# Patient Record
Sex: Female | Born: 1985 | Race: White | Hispanic: No | Marital: Single | Smoking: Former smoker
Health system: Southern US, Community
[De-identification: ages and names within clinical notes are randomized; demographics above are authoritative.]

## PROBLEM LIST (undated history)

## (undated) DIAGNOSIS — R102 Pelvic and perineal pain: Secondary | ICD-10-CM

## (undated) DIAGNOSIS — N926 Irregular menstruation, unspecified: Secondary | ICD-10-CM

## (undated) DIAGNOSIS — E079 Disorder of thyroid, unspecified: Secondary | ICD-10-CM

## (undated) DIAGNOSIS — C73 Malignant neoplasm of thyroid gland: Secondary | ICD-10-CM

## (undated) DIAGNOSIS — N83201 Unspecified ovarian cyst, right side: Secondary | ICD-10-CM

## (undated) HISTORY — PX: THYROID SURGERY: SHX805

## (undated) HISTORY — PX: TONSILLECTOMY: SUR1361

## (undated) HISTORY — DX: Unspecified ovarian cyst, right side: N83.201

## (undated) HISTORY — PX: OTHER SURGICAL HISTORY: SHX169

## (undated) HISTORY — DX: Irregular menstruation, unspecified: N92.6

## (undated) HISTORY — DX: Pelvic and perineal pain: R10.2

---

## 2014-10-06 ENCOUNTER — Emergency Department (HOSPITAL_COMMUNITY)
Admission: EM | Admit: 2014-10-06 | Discharge: 2014-10-06 | Disposition: A | Discharge status: Home / Self Care | Payer: Self-pay | Attending: Emergency Medicine | Admitting: Emergency Medicine

## 2014-10-06 ENCOUNTER — Encounter (HOSPITAL_COMMUNITY): Payer: Self-pay | Admitting: *Deleted

## 2014-10-06 DIAGNOSIS — Z8639 Personal history of other endocrine, nutritional and metabolic disease: Secondary | ICD-10-CM | POA: Insufficient documentation

## 2014-10-06 DIAGNOSIS — R590 Localized enlarged lymph nodes: Secondary | ICD-10-CM | POA: Insufficient documentation

## 2014-10-06 DIAGNOSIS — K029 Dental caries, unspecified: Secondary | ICD-10-CM | POA: Insufficient documentation

## 2014-10-06 DIAGNOSIS — K047 Periapical abscess without sinus: Secondary | ICD-10-CM | POA: Insufficient documentation

## 2014-10-06 DIAGNOSIS — K002 Abnormalities of size and form of teeth: Secondary | ICD-10-CM | POA: Insufficient documentation

## 2014-10-06 HISTORY — DX: Disorder of thyroid, unspecified: E07.9

## 2014-10-06 MED ORDER — AMOXICILLIN 500 MG PO CAPS: 500.0000 mg | ORAL_CAPSULE | Freq: Three times a day (TID) | ORAL | Status: AC

## 2014-10-06 MED ORDER — TRAMADOL HCL 50 MG PO TABS: 50.0000 mg | ORAL_TABLET | Freq: Four times a day (QID) | ORAL | Status: DC | PRN

## 2014-10-06 NOTE — ED Notes (Signed)
Pt says rt side of face hurts, thinks it is her teeth or rt ear.

## 2014-10-06 NOTE — ED Provider Notes (Signed)
CSN: [id number]     Arrival date & time 10/06/14  1517 History   First MD Initiated Contact with Patient 10/06/14 1606     Chief Complaint  Patient presents with  . Facial Pain     (Consider location/radiation/quality/duration/timing/severity/associated sxs/prior Treatment) The history is provided by the patient.   Anne Hamilton is a 29 y.o. female presenting with a several day history of dental pain and gingival swelling.   The patient has a history of injury and/or decay in the teeth involved which has recently started to cause increased  pain. She had dental extractions by her dentist in [facility] one month ago on the left side of her mouth, has just moved to this area and now has new pain in old decayed teeth on the left. There has been no fevers, chills, nausea or vomiting, also no complaint of difficulty swallowing, although chewing makes pain worse.  The patient has tried tylenol, motrin and peroxide swish and spit without relief of symptoms.      Past Medical History  Diagnosis Date  . Thyroid disease    Past Surgical History  Procedure Laterality Date  . Tonsillectomy    . Tubes in ears.    . Thyroid surgery     History reviewed. No pertinent family history. History  Substance Use Topics  . Smoking status: Never Smoker   . Smokeless tobacco: Not on file  . Alcohol Use: No   OB History    No data available     Review of Systems  Constitutional: Negative for fever.  HENT: Positive for dental problem. Negative for facial swelling and sore throat.   Respiratory: Negative for shortness of breath.   Musculoskeletal: Negative for neck pain and neck stiffness.      Allergies  Diclofenac  Home Medications   Prior to Admission medications   Medication Sig Start Date End Date Taking? Authorizing Provider  amoxicillin (AMOXIL) 500 MG capsule Take 1 capsule (500 mg total) by mouth 3 (three) times daily. 10/06/14 10/16/14  Evalee Jefferson, PA-C  traMADol (ULTRAM) 50 MG tablet Take 1  tablet (50 mg total) by mouth every 6 (six) hours as needed. 10/06/14   Evalee Jefferson, PA-C   BP 118/88 mmHg  Pulse 76  Temp(Src) 99 F (37.2 C) (Oral)  Resp 18  Ht 5\' 6"  (1.676 m)  Wt 134 lb (60.782 kg)  BMI 21.64 kg/m2  SpO2 100%  LMP 09/05/2014 (Approximate) Physical Exam  Constitutional: She is oriented to person, place, and time. She appears well-developed and well-nourished. No distress.  HENT:  Head: Normocephalic and atraumatic.  Right Ear: Tympanic membrane and external ear normal.  Left Ear: Tympanic membrane and external ear normal.  Mouth/Throat: Oropharynx is clear and moist and mucous membranes are normal. No oral lesions. No trismus in the jaw. Abnormal dentition. Dental caries present. No dental abscesses.  Deep decay of right upper premolars and molars along the gingival line, gingival edema and erythema without abscess.  No drainage. Deep decay of right lower 2nd molar as well.  Eyes: Conjunctivae are normal.  Neck: Normal range of motion. Neck supple.  Cardiovascular: Normal rate and normal heart sounds.   Pulmonary/Chest: Effort normal.  Abdominal: She exhibits no distension.  Musculoskeletal: Normal range of motion.  Lymphadenopathy:       Head (right side): Submandibular adenopathy present.    She has no cervical adenopathy.  Neurological: She is alert and oriented to person, place, and time.  Skin: Skin is warm and  dry. No erythema.  Psychiatric: She has a normal mood and affect.    ED Course  Procedures (including critical care time) Labs Review Labs Reviewed - No data to display  Imaging Review No results found.   EKG Interpretation None      MDM   Final diagnoses:  Dental infection    Dental referrals given.  Amoxil, tramadol.  Prn f/u.    Evalee Jefferson, PA-C 10/06/14 1628  Daleen Bo, MD 10/07/14 [phone]

## 2014-10-06 NOTE — Discharge Instructions (Signed)
Dental Abscess A dental abscess is a collection of infected fluid (pus) from a bacterial infection in the inner part of the tooth (pulp). It usually occurs at the end of the tooth's root.  CAUSES   Severe tooth decay.  Trauma to the tooth that allows bacteria to enter into the pulp, such as a broken or chipped tooth. SYMPTOMS   Severe pain in and around the infected tooth.  Swelling and redness around the abscessed tooth or in the mouth or face.  Tenderness.  Pus drainage.  Bad breath.  Bitter taste in the mouth.  Difficulty swallowing.  Difficulty opening the mouth.  Nausea.  Vomiting.  Chills.  Swollen neck glands. DIAGNOSIS   A medical and dental history will be taken.  An examination will be performed by tapping on the abscessed tooth.  X-rays may be taken of the tooth to identify the abscess. TREATMENT The goal of treatment is to eliminate the infection. You may be prescribed antibiotic medicine to stop the infection from spreading. A root canal may be performed to save the tooth. If the tooth cannot be saved, it may be pulled (extracted) and the abscess may be drained.  HOME CARE INSTRUCTIONS  Only take over-the-counter or prescription medicines for pain, fever, or discomfort as directed by your caregiver.  Rinse your mouth (gargle) often with salt water ( tsp salt in 8 oz [250 ml] of warm water) to relieve pain or swelling.  Do not drive after taking pain medicine (narcotics).  Do not apply heat to the outside of your face.  Return to your dentist for further treatment as directed. SEEK MEDICAL CARE IF:  Your pain is not helped by medicine.  Your pain is getting worse instead of better. SEEK IMMEDIATE MEDICAL CARE IF:  You have a fever or persistent symptoms for more than 2-3 days.  You have a fever and your symptoms suddenly get worse.  You have chills or a very bad headache.  You have problems breathing or swallowing.  You have trouble  opening your mouth.  You have swelling in the neck or around the eye. Document Released: 06/11/2005 Document Revised: 03/05/2012 Document Reviewed: 09/19/2010 [facility] Patient Information 2015 [place], [place]. This information is not intended to replace advice given to you by your health care provider. Make sure you discuss any questions you have with your health care provider.   Complete your entire course of antibiotics as prescribed.  You  may use the tramadol for pain relief but do not drive within 4 hours of taking as this will make you drowsy.  Avoid applying heat or ice to this abscess area which can worsen your symptoms.  You may use warm salt water swish and spit treatment  to keep this area cleaner as discussed.  Call the dentist listed above for further management of your symptoms.

## 2014-11-01 ENCOUNTER — Emergency Department (HOSPITAL_COMMUNITY)
Admission: EM | Admit: 2014-11-01 | Discharge: 2014-11-02 | Disposition: A | Discharge status: Home / Self Care | Payer: Self-pay | Attending: Emergency Medicine | Admitting: Emergency Medicine

## 2014-11-01 ENCOUNTER — Encounter (HOSPITAL_COMMUNITY): Payer: Self-pay | Admitting: Cardiology

## 2014-11-01 DIAGNOSIS — N3000 Acute cystitis without hematuria: Secondary | ICD-10-CM

## 2014-11-01 DIAGNOSIS — N8320 Unspecified ovarian cysts: Secondary | ICD-10-CM | POA: Insufficient documentation

## 2014-11-01 DIAGNOSIS — N3090 Cystitis, unspecified without hematuria: Secondary | ICD-10-CM | POA: Insufficient documentation

## 2014-11-01 DIAGNOSIS — N83202 Unspecified ovarian cyst, left side: Secondary | ICD-10-CM

## 2014-11-01 DIAGNOSIS — Z3202 Encounter for pregnancy test, result negative: Secondary | ICD-10-CM | POA: Insufficient documentation

## 2014-11-01 DIAGNOSIS — Z8639 Personal history of other endocrine, nutritional and metabolic disease: Secondary | ICD-10-CM | POA: Insufficient documentation

## 2014-11-01 LAB — CBC WITH DIFFERENTIAL/PLATELET
BASOS ABS: 0.1 10*3/uL (ref 0.0–0.1)
BASOS PCT: 1 % (ref 0–1)
Eosinophils Absolute: 0.4 10*3/uL (ref 0.0–0.7)
Eosinophils Relative: 6 % — ABNORMAL HIGH (ref 0–5)
HEMATOCRIT: 36.4 % (ref 36.0–46.0)
Hemoglobin: 12.6 g/dL (ref 12.0–15.0)
LYMPHS PCT: 42 % (ref 12–46)
Lymphs Abs: 2.8 10*3/uL (ref 0.7–4.0)
MCH: 30.4 pg (ref 26.0–34.0)
MCHC: 34.6 g/dL (ref 30.0–36.0)
MCV: 87.9 fL (ref 78.0–100.0)
MONO ABS: 0.2 10*3/uL (ref 0.1–1.0)
Monocytes Relative: 4 % (ref 3–12)
NEUTROS ABS: 3.1 10*3/uL (ref 1.7–7.7)
NEUTROS PCT: 47 % (ref 43–77)
Platelets: 227 10*3/uL (ref 150–400)
RBC: 4.14 MIL/uL (ref 3.87–5.11)
RDW: 11.8 % (ref 11.5–15.5)
WBC: 6.6 10*3/uL (ref 4.0–10.5)

## 2014-11-01 LAB — BASIC METABOLIC PANEL
Anion gap: 7 (ref 5–15)
BUN: 14 mg/dL (ref 6–20)
CALCIUM: 9.2 mg/dL (ref 8.9–10.3)
CHLORIDE: 104 mmol/L (ref 101–111)
CO2: 26 mmol/L (ref 22–32)
Creatinine, Ser: 0.84 mg/dL (ref 0.44–1.00)
GFR calc Af Amer: >60 mL/min (ref >60)
GFR calc non Af Amer: >60 mL/min (ref >60)
GLUCOSE: 111 mg/dL — AB (ref 70–99)
Potassium: 4 mmol/L (ref 3.5–5.1)
Sodium: 137 mmol/L (ref 135–145)

## 2014-11-01 LAB — URINALYSIS, ROUTINE W REFLEX MICROSCOPIC
Bilirubin Urine: NEGATIVE
GLUCOSE, UA: NEGATIVE mg/dL
Ketones, ur: NEGATIVE mg/dL
Nitrite: NEGATIVE
PH: 5.5 (ref 5.0–8.0)
Protein, ur: NEGATIVE mg/dL
Specific Gravity, Urine: 1.025 (ref 1.005–1.030)
Urobilinogen, UA: 0.2 mg/dL (ref 0.0–1.0)

## 2014-11-01 LAB — WET PREP, GENITAL
CLUE CELLS WET PREP: NONE SEEN
Trich, Wet Prep: NONE SEEN
YEAST WET PREP: NONE SEEN

## 2014-11-01 LAB — URINE MICROSCOPIC-ADD ON

## 2014-11-01 LAB — PREGNANCY, URINE: Preg Test, Ur: NEGATIVE

## 2014-11-01 MED ORDER — OXYCODONE-ACETAMINOPHEN 5-325 MG PO TABS: 1.0000 | ORAL_TABLET | Freq: Four times a day (QID) | ORAL | Status: DC | PRN

## 2014-11-01 MED ORDER — SULFAMETHOXAZOLE-TRIMETHOPRIM 800-160 MG PO TABS: 1.0000 | ORAL_TABLET | Freq: Two times a day (BID) | ORAL | Status: AC

## 2014-11-01 MED ORDER — OXYCODONE-ACETAMINOPHEN 5-325 MG PO TABS
1.0000 | ORAL_TABLET | Freq: Once | ORAL | Status: AC
  Administered 2014-11-01: 1 via ORAL
  Filled 2014-11-01: qty 1

## 2014-11-01 NOTE — ED Notes (Signed)
LLQ abdominal pain since yesterday.

## 2014-11-01 NOTE — Discharge Instructions (Signed)
Ovarian Cyst An ovarian cyst is a fluid-filled sac that forms on an ovary. The ovaries are small organs that produce eggs in women. Various types of cysts can form on the ovaries. Most are not cancerous. Many do not cause problems, and they often go away on their own. Some may cause symptoms and require treatment. Common types of ovarian cysts include:  Functional cysts--These cysts may occur every month during the menstrual cycle. This is normal. The cysts usually go away with the next menstrual cycle if the woman does not get pregnant. Usually, there are no symptoms with a functional cyst.  Endometrioma cysts--These cysts form from the tissue that lines the uterus. They are also called "chocolate cysts" because they become filled with blood that turns brown. This type of cyst can cause pain in the lower abdomen during intercourse and with your menstrual period.  Cystadenoma cysts--This type develops from the cells on the outside of the ovary. These cysts can get very big and cause lower abdomen pain and pain with intercourse. This type of cyst can twist on itself, cut off its blood supply, and cause severe pain. It can also easily rupture and cause a lot of pain.  Dermoid cysts--This type of cyst is sometimes found in both ovaries. These cysts may contain different kinds of body tissue, such as skin, teeth, hair, or cartilage. They usually do not cause symptoms unless they get very big.  Theca lutein cysts--These cysts occur when too much of a certain hormone (human chorionic gonadotropin) is produced and overstimulates the ovaries to produce an egg. This is most common after procedures used to assist with the conception of a baby (in vitro fertilization). CAUSES   Fertility drugs can cause a condition in which multiple large cysts are formed on the ovaries. This is called ovarian hyperstimulation syndrome.  A condition called polycystic ovary syndrome can cause hormonal imbalances that can lead to  nonfunctional ovarian cysts. SIGNS AND SYMPTOMS  Many ovarian cysts do not cause symptoms. If symptoms are present, they may include:  Pelvic pain or pressure.  Pain in the lower abdomen.  Pain during sexual intercourse.  Increasing girth (swelling) of the abdomen.  Abnormal menstrual periods.  Increasing pain with menstrual periods.  Stopping having menstrual periods without being pregnant. DIAGNOSIS  These cysts are commonly found during a routine or annual pelvic exam. Tests may be ordered to find out more about the cyst. These tests may include:  Ultrasound.  X-ray of the pelvis.  CT scan.  MRI.  Blood tests. TREATMENT  Many ovarian cysts go away on their own without treatment. Your health care provider may want to check your cyst regularly for 2-3 months to see if it changes. For women in menopause, it is particularly important to monitor a cyst closely because of the higher rate of ovarian cancer in menopausal women. When treatment is needed, it may include any of the following:  A procedure to drain the cyst (aspiration). This may be done using a long needle and ultrasound. It can also be done through a laparoscopic procedure. This involves using a thin, lighted tube with a tiny camera on the end (laparoscope) inserted through a small incision.  Surgery to remove the whole cyst. This may be done using laparoscopic surgery or an open surgery involving a larger incision in the lower abdomen.  Hormone treatment or birth control pills. These methods are sometimes used to help dissolve a cyst. HOME CARE INSTRUCTIONS   Only take over-the-counter  or prescription medicines as directed by your health care provider.  Follow up with your health care provider as directed.  Get regular pelvic exams and Pap tests. SEEK MEDICAL CARE IF:   Your periods are late, irregular, or painful, or they stop.  Your pelvic pain or abdominal pain does not go away.  Your abdomen becomes  larger or swollen.  You have pressure on your bladder or trouble emptying your bladder completely.  You have pain during sexual intercourse.  You have feelings of fullness, pressure, or discomfort in your stomach.  You lose weight for no apparent reason.  You feel generally ill.  You become constipated.  You lose your appetite.  You develop acne.  You have an increase in body and facial hair.  You are gaining weight, without changing your exercise and eating habits.  You think you are pregnant. SEEK IMMEDIATE MEDICAL CARE IF:   You have increasing abdominal pain.  You feel sick to your stomach (nauseous), and you throw up (vomit).  You develop a fever that comes on suddenly.  You have abdominal pain during a bowel movement.  Your menstrual periods become heavier than usual. MAKE SURE YOU:  Understand these instructions.  Will watch your condition.  Will get help right away if you are not doing well or get worse. Document Released: 06/11/2005 Document Revised: 06/16/2013 Document Reviewed: 02/16/2013 [facility] Patient Information 2015 [place], [place]. This information is not intended to replace advice given to you by your health care provider. Make sure you discuss any questions you have with your health care provider.  Urinary Tract Infection Urinary tract infections (UTIs) can develop anywhere along your urinary tract. Your urinary tract is your body's drainage system for removing wastes and extra water. Your urinary tract includes two kidneys, two ureters, a bladder, and a urethra. Your kidneys are a pair of bean-shaped organs. Each kidney is about the size of your fist. They are located below your ribs, one on each side of your spine. CAUSES Infections are caused by microbes, which are microscopic organisms, including fungi, viruses, and bacteria. These organisms are so small that they can only be seen through a microscope. Bacteria are the microbes that most commonly  cause UTIs. SYMPTOMS  Symptoms of UTIs may vary by age and gender of the patient and by the location of the infection. Symptoms in young women typically include a frequent and intense urge to urinate and a painful, burning feeling in the bladder or urethra during urination. Older women and men are more likely to be tired, shaky, and weak and have muscle aches and abdominal pain. A fever may mean the infection is in your kidneys. Other symptoms of a kidney infection include pain in your back or sides below the ribs, nausea, and vomiting. DIAGNOSIS To diagnose a UTI, your caregiver will ask you about your symptoms. Your caregiver also will ask to provide a urine sample. The urine sample will be tested for bacteria and white blood cells. White blood cells are made by your body to help fight infection. TREATMENT  Typically, UTIs can be treated with medication. Because most UTIs are caused by a bacterial infection, they usually can be treated with the use of antibiotics. The choice of antibiotic and length of treatment depend on your symptoms and the type of bacteria causing your infection. HOME CARE INSTRUCTIONS  If you were prescribed antibiotics, take them exactly as your caregiver instructs you. Finish the medication even if you feel better after you have  only taken some of the medication.  Drink enough water and fluids to keep your urine clear or pale yellow.  Avoid caffeine, tea, and carbonated beverages. They tend to irritate your bladder.  Empty your bladder often. Avoid holding urine for long periods of time.  Empty your bladder before and after sexual intercourse.  After a bowel movement, women should cleanse from front to back. Use each tissue only once. SEEK MEDICAL CARE IF:   You have back pain.  You develop a fever.  Your symptoms do not begin to resolve within 3 days. SEEK IMMEDIATE MEDICAL CARE IF:   You have severe back pain or lower abdominal pain.  You develop  chills.  You have nausea or vomiting.  You have continued burning or discomfort with urination. MAKE SURE YOU:   Understand these instructions.  Will watch your condition.  Will get help right away if you are not doing well or get worse. Document Released: 03/21/2005 Document Revised: 12/11/2011 Document Reviewed: 07/20/2011 [facility] Patient Information 2015 [place], [place]. This information is not intended to replace advice given to you by your health care provider. Make sure you discuss any questions you have with your health care provider.

## 2014-11-01 NOTE — ED Provider Notes (Signed)
CSN: [id number]     Arrival date & time 11/01/14  1746 History  This chart was scribed for Davonna Belling, MD by Tula Nakayama, ED Scribe. This patient was seen in room APA09/APA09 and the patient's care was started at 9:45 PM.    Chief Complaint  Patient presents with  . Abdominal Pain   The history is provided by the patient. No language interpreter was used.    HPI Comments: Anne Hamilton is a 29 y.o. female with a history of ovarian cysts who presents to the Emergency Department complaining of constant, moderate LLQ abdominal pain that started a few days ago. She states pain becomes worse with walking. Pt has tried Ibruprofen, Tylenol and Tramadol with no relief. Pt reports a history of ovarian cysts and states current pain is similar to, but more severe than, prior episodes. Her LMP was regular. She denies possibility of pregnancy. Pt also denies vaginal discharge, nausea, vomiting, diarrhea and fever as associated symptoms.  Past Medical History  Diagnosis Date  . Thyroid disease    Past Surgical History  Procedure Laterality Date  . Tonsillectomy    . Tubes in ears.    . Thyroid surgery     History reviewed. No pertinent family history. History  Substance Use Topics  . Smoking status: Never Smoker   . Smokeless tobacco: Not on file  . Alcohol Use: No   OB History    No data available     Review of Systems  Constitutional: Negative for fever.  Gastrointestinal: Positive for abdominal pain. Negative for nausea and vomiting.  Genitourinary: Negative for vaginal discharge.  All other systems reviewed and are negative.     Allergies  Diclofenac  Home Medications   Prior to Admission medications   Medication Sig Start Date End Date Taking? Authorizing Provider  acetaminophen (TYLENOL) 500 MG tablet Take 1,000 mg by mouth every 6 (six) hours as needed for mild pain or moderate pain.   Yes Historical Provider, MD  ibuprofen (ADVIL,MOTRIN) 200 MG tablet Take 200-400  mg by mouth every 6 (six) hours as needed for headache, mild pain or moderate pain.   Yes Historical Provider, MD  oxyCODONE-acetaminophen (PERCOCET/ROXICET) 5-325 MG per tablet Take 1-2 tablets by mouth every 6 (six) hours as needed for severe pain. 11/01/14   Davonna Belling, MD  sulfamethoxazole-trimethoprim (BACTRIM DS,SEPTRA DS) 800-160 MG per tablet Take 1 tablet by mouth 2 (two) times daily. 11/01/14 11/08/14  Davonna Belling, MD  traMADol (ULTRAM) 50 MG tablet Take 1 tablet (50 mg total) by mouth every 6 (six) hours as needed. Patient not taking: Reported on 11/01/2014 10/06/14   Evalee Jefferson, PA-C   BP 123/88 mmHg  Pulse 75  Temp(Src) 98.1 F (36.7 C) (Oral)  Resp 14  Ht 5\' 6"  (1.676 m)  Wt 135 lb (61.236 kg)  BMI 21.80 kg/m2  SpO2 100%  LMP 09/28/2014 Physical Exam  Constitutional: She appears well-developed and well-nourished. No distress.  HENT:  Head: Normocephalic and atraumatic.  Eyes: Conjunctivae and EOM are normal.  Neck: Neck supple. No tracheal deviation present.  Cardiovascular: Normal rate.   Pulmonary/Chest: Effort normal. No respiratory distress.  Abdominal: Soft. There is tenderness. There is no rebound and no guarding.  LLQ tenderness  Skin: Skin is warm and dry.  Psychiatric: She has a normal mood and affect. Her behavior is normal.  Nursing note and vitals reviewed.  pelvic exam showed some white cervical discharge without adnexal tenderness. There is some cervical motion tenderness.  ED Course  Procedures   DIAGNOSTIC STUDIES: Oxygen Saturation is 100% on RA, normal by my interpretation.    COORDINATION OF CARE: 9:52 PM Discussed treatment plan with pt which includes lab work and a pelvic exam. Pt agreed to plan.   Labs Review Labs Reviewed  WET PREP, GENITAL - Abnormal; Notable for the following:    WBC, Wet Prep HPF POC FEW (*)    All other components within normal limits  URINALYSIS, ROUTINE W REFLEX MICROSCOPIC - Abnormal; Notable for the  following:    APPearance HAZY (*)    Hgb urine dipstick TRACE (*)    Leukocytes, UA MODERATE (*)    All other components within normal limits  CBC WITH DIFFERENTIAL/PLATELET - Abnormal; Notable for the following:    Eosinophils Relative 6 (*)    All other components within normal limits  BASIC METABOLIC PANEL - Abnormal; Notable for the following:    Glucose, Bld 111 (*)    All other components within normal limits  URINE MICROSCOPIC-ADD ON - Abnormal; Notable for the following:    Squamous Epithelial / LPF MANY (*)    Bacteria, UA MANY (*)    All other components within normal limits  URINE CULTURE  PREGNANCY, URINE  RPR  HIV ANTIBODY (ROUTINE TESTING)  GC/CHLAMYDIA PROBE AMP ([place])    Imaging Review No results found.   EKG Interpretation None      MDM   Final diagnoses:  Acute cystitis without hematuria  Cyst of left ovary     patient with lower abdominal pain. Previous history of ovarian cysts. May be a component of that on this pain but does have a UTI also. Laboratory reassuring. Will discharge home with antibiotics to cover  UTI I personally performed the services described in this documentation, which was scribed in my presence. The recorded information has been reviewed and is accurate.     Davonna Belling, MD 11/04/14 [phone]

## 2014-11-03 LAB — URINE CULTURE

## 2014-11-03 LAB — RPR: RPR Ser Ql: NONREACTIVE

## 2014-11-03 LAB — GC/CHLAMYDIA PROBE AMP (~~LOC~~) NOT AT ARMC
CHLAMYDIA, DNA PROBE: NEGATIVE
NEISSERIA GONORRHEA: NEGATIVE

## 2014-11-03 LAB — HIV ANTIBODY (ROUTINE TESTING W REFLEX): HIV Screen 4th Generation wRfx: NONREACTIVE

## 2014-11-24 ENCOUNTER — Telehealth: Payer: Self-pay | Admitting: Obstetrics and Gynecology

## 2014-11-24 NOTE — Telephone Encounter (Signed)
Pt c/o stomach feels very bloated today, also she is having generalized abd pain, pain medication isnt working, would like to know what to do??? pls advise

## 2014-11-24 NOTE — Telephone Encounter (Signed)
Pt called and would like a call back at [phone].Marland Kitchendid not leave a reason why..thanks Hector

## 2014-11-24 NOTE — Telephone Encounter (Signed)
Please instruct her to go to [facility] ED if pain is worsening to be evaluated, as it may rupture, otherwise she can continue meds as prescribed. Rest for the evening and use a heating pad to lower abdomen as needed.

## 2014-11-25 ENCOUNTER — Telehealth: Payer: Self-pay | Admitting: *Deleted

## 2014-11-25 ENCOUNTER — Other Ambulatory Visit: Payer: Self-pay | Admitting: Obstetrics and Gynecology

## 2014-11-25 MED ORDER — OXYCODONE-ACETAMINOPHEN 5-325 MG PO TABS: 1.0000 | ORAL_TABLET | Freq: Four times a day (QID) | ORAL | Status: DC | PRN

## 2014-11-25 NOTE — Telephone Encounter (Signed)
NEEDS REFILL ON PAIN MED/ AND WORK NOTE FOR NEXT WEEK

## 2014-11-25 NOTE — Telephone Encounter (Signed)
PLS ADVISE IF PT CAN HAVE REFILL AND WORK NOTE

## 2014-11-25 NOTE — Telephone Encounter (Signed)
-----   Message from Evonnie Pat, [place] sent at 11/25/2014 11:49 AM EDT ----- Regarding: work note Please let her know I cannot give another work note extending her leave at this time due to the previously given note that covers through beginning of next week, if she feels she is unable to work next week will need to be seen again to get new note

## 2014-11-25 NOTE — Telephone Encounter (Signed)
ADVISED PT IF PAIN WORSE SHE MAY NEED TO GO TO ER,PT VOICED Anne Hamilton

## 2014-11-26 NOTE — Telephone Encounter (Signed)
Notified pt rx @ front desk

## 2014-11-30 ENCOUNTER — Encounter: Payer: Self-pay | Admitting: *Deleted

## 2014-12-01 ENCOUNTER — Encounter: Payer: Self-pay | Admitting: Obstetrics and Gynecology

## 2014-12-01 ENCOUNTER — Ambulatory Visit (INDEPENDENT_AMBULATORY_CARE_PROVIDER_SITE_OTHER): Payer: Self-pay | Admitting: Obstetrics and Gynecology

## 2014-12-01 VITALS — BP 129/88 | HR 71 | Wt 137.6 lb

## 2014-12-01 DIAGNOSIS — R140 Abdominal distension (gaseous): Secondary | ICD-10-CM

## 2014-12-01 DIAGNOSIS — R102 Pelvic and perineal pain: Secondary | ICD-10-CM

## 2014-12-01 DIAGNOSIS — R300 Dysuria: Secondary | ICD-10-CM

## 2014-12-01 LAB — POCT URINALYSIS DIPSTICK
BILIRUBIN UA: NEGATIVE
GLUCOSE UA: NEGATIVE
Ketones, UA: NEGATIVE
Leukocytes, UA: NEGATIVE
Nitrite, UA: NEGATIVE
PH UA: 5
Protein, UA: 30
Spec Grav, UA: 1.025
UROBILINOGEN UA: 0.2

## 2014-12-01 NOTE — Progress Notes (Signed)
Name: Anne Hamilton   MRN: [id number]    DOB: May 14, 1986   Date:12/01/2014       Progress Note  Subjective  Chief Complaint  Chief Complaint  Patient presents with  . Follow-up    ovarian cyst  . painful urination    HPI  Reports no resolution of pelvic pain from ovarian cyst, but not worse, desires refill pain medication, also reports onset of upper abdominal bloating x 10 days, worse after eating food, with no change in BMs. Has been taking vicoden q4h, and ibuprofen around the clock.    Past Medical History  Diagnosis Date  . Thyroid disease   . Pelvic pain in female   . Ovarian cyst, right   . Irregular periods     Past Surgical History  Procedure Laterality Date  . Tonsillectomy    . Tubes in ears.    . Thyroid surgery      Family History  Problem Relation Age of Onset  . Adopted: Yes    History   Social History  . Marital Status: Single    Spouse Name: N/A  . Number of Children: N/A  . Years of Education: N/A   Occupational History  . Not on file.   Social History Main Topics  . Smoking status: Never Smoker   . Smokeless tobacco: Never Used  . Alcohol Use: No  . Drug Use: No  . Sexual Activity:    Partners: Female   Other Topics Concern  . Not on file   Social History Narrative     Current outpatient prescriptions:  .  acetaminophen (TYLENOL) 500 MG tablet, Take 1,000 mg by mouth every 6 (six) hours as needed for mild pain or moderate pain., Disp: , Rfl:  .  ibuprofen (ADVIL,MOTRIN) 800 MG tablet, Take 800 mg by mouth 3 (three) times daily., Disp: , Rfl: 0 .  oxyCODONE-acetaminophen (PERCOCET/ROXICET) 5-325 MG per tablet, Take 1-2 tablets by mouth every 6 (six) hours as needed for severe pain., Disp: 30 tablet, Rfl: 0 .  traMADol (ULTRAM) 50 MG tablet, Take 1 tablet (50 mg total) by mouth every 6 (six) hours as needed., Disp: 15 tablet, Rfl: 0  Allergies  Allergen Reactions  . Diclofenac Rash     ROS  Reports no resolution of pelvic  pain from ovarian cyst, but not worse, desires refill pain medication, also reports onset of upper abdominal bloating x 10 days, worse after eating food, with no change in BMs. Has been taking vicoden q4h, and ibuprofen around the clock.   Objective  Filed Vitals:   12/01/14 1007  BP: 129/88  Pulse: 71  Weight: 137 lb 9.6 oz (62.415 kg)    Physical Exam   A&Ox4 Restless when sitting on exam table Abdomen guarded when attempt to palpate, normal BSx4 Pelvic: scant bloody discharge noted; negative CMT; right adnexa tender on palpation but not enlarged; left adnexa nontender; uterus normal ; bladder nontender  Recent Results (from the past 2160 hour(s))  GC/Chlamydia probe amp ([place])     Status: None   Collection Time: 11/01/14 12:00 AM  Result Value Ref Range   Chlamydia Negative     Comment: Normal Reference Range - Negative   Neisseria gonorrhea Negative     Comment: Normal Reference Range - Negative  Pregnancy, urine     Status: None   Collection Time: 11/01/14  6:10 PM  Result Value Ref Range   Preg Test, Ur NEGATIVE NEGATIVE    Comment:  THE SENSITIVITY OF THIS METHODOLOGY IS >20 mIU/mL.   Urinalysis, Routine w reflex microscopic     Status: Abnormal   Collection Time: 11/01/14  6:10 PM  Result Value Ref Range   Color, Urine YELLOW YELLOW   APPearance HAZY (A) CLEAR   Specific Gravity, Urine 1.025 1.005 - 1.030   pH 5.5 5.0 - 8.0   Glucose, UA NEGATIVE NEGATIVE mg/dL   Hgb urine dipstick TRACE (A) NEGATIVE   Bilirubin Urine NEGATIVE NEGATIVE   Ketones, ur NEGATIVE NEGATIVE mg/dL   Protein, ur NEGATIVE NEGATIVE mg/dL   Urobilinogen, UA 0.2 0.0 - 1.0 mg/dL   Nitrite NEGATIVE NEGATIVE   Leukocytes, UA MODERATE (A) NEGATIVE  Urine microscopic-add on     Status: Abnormal   Collection Time: 11/01/14  6:10 PM  Result Value Ref Range   Squamous Epithelial / LPF MANY (A) RARE   WBC, UA 11-20 <3 WBC/hpf   RBC / HPF 3-6 <3 RBC/hpf   Bacteria, UA MANY (A)  RARE  Urine culture     Status: None   Collection Time: 11/01/14  6:10 PM  Result Value Ref Range   Specimen Description URINE, CLEAN CATCH    Special Requests NONE    Colony Count      45,000 COLONIES/ML Performed at [facility]    Culture      Multiple bacterial morphotypes present, none predominant. Suggest appropriate recollection if clinically indicated. Performed at [facility]    Report Status 11/03/2014 FINAL   CBC with Differential     Status: Abnormal   Collection Time: 11/01/14  8:41 PM  Result Value Ref Range   WBC 6.6 4.0 - 10.5 K/uL   RBC 4.14 3.87 - 5.11 MIL/uL   Hemoglobin 12.6 12.0 - 15.0 g/dL   HCT 36.4 36.0 - 46.0 %   MCV 87.9 78.0 - 100.0 fL   MCH 30.4 26.0 - 34.0 pg   MCHC 34.6 30.0 - 36.0 g/dL   RDW 11.8 11.5 - 15.5 %   Platelets 227 150 - 400 K/uL   Neutrophils Relative % 47 43 - 77 %   Neutro Abs 3.1 1.7 - 7.7 K/uL   Lymphocytes Relative 42 12 - 46 %   Lymphs Abs 2.8 0.7 - 4.0 K/uL   Monocytes Relative 4 3 - 12 %   Monocytes Absolute 0.2 0.1 - 1.0 K/uL   Eosinophils Relative 6 (H) 0 - 5 %   Eosinophils Absolute 0.4 0.0 - 0.7 K/uL   Basophils Relative 1 0 - 1 %   Basophils Absolute 0.1 0.0 - 0.1 K/uL  Basic metabolic panel     Status: Abnormal   Collection Time: 11/01/14  8:41 PM  Result Value Ref Range   Sodium 137 135 - 145 mmol/L   Potassium 4.0 3.5 - 5.1 mmol/L   Chloride 104 101 - 111 mmol/L   CO2 26 22 - 32 mmol/L   Glucose, Bld 111 (H) 70 - 99 mg/dL   BUN 14 6 - 20 mg/dL   Creatinine, Ser 0.84 0.44 - 1.00 mg/dL   Calcium 9.2 8.9 - 10.3 mg/dL   GFR calc non Af Amer >60 >60 mL/min   GFR calc Af Amer >60 >60 mL/min    Comment: (NOTE) The eGFR has been calculated using the CKD EPI equation. This calculation has not been validated in all clinical situations. eGFR's persistently <60 mL/min signify possible Chronic Kidney Disease.    Anion gap 7 5 - 15  RPR  Status: None   Collection Time: 11/01/14  9:59 PM  Result  Value Ref Range   RPR Ser Ql Non Reactive Non Reactive    Comment: (NOTE) Performed At: [facility] [place], [place] [id number] Lindon Romp MD [id number]   HIV antibody     Status: None   Collection Time: 11/01/14  9:59 PM  Result Value Ref Range   HIV Screen 4th Generation wRfx Non Reactive Non Reactive    Comment: (NOTE) Performed At: [facility] [address] [place], [place] [id number] Lindon Romp MD FY:[id number]   Wet prep, genital     Status: Abnormal   Collection Time: 11/01/14 11:05 PM  Result Value Ref Range   Yeast Wet Prep HPF POC NONE SEEN NONE SEEN   Trich, Wet Prep NONE SEEN NONE SEEN   Clue Cells Wet Prep HPF POC NONE SEEN NONE SEEN   WBC, Wet Prep HPF POC FEW (A) NONE SEEN  POCT urinalysis dipstick     Status: Abnormal   Collection Time: 12/01/14 10:20 AM  Result Value Ref Range   Color, UA dark yellow    Clarity, UA clear    Glucose, UA neg    Bilirubin, UA neg    Ketones, UA neg    Spec Grav, UA 1.025    Blood, UA large    pH, UA 5.0    Protein, UA 30    Urobilinogen, UA 0.2    Nitrite, UA neg    Leukocytes, UA Negative      Assessment & Plan  Problem List Items Addressed This Visit    None    Visit Diagnoses    Dysuria    -  Primary    Relevant Orders    POCT urinalysis dipstick (Completed)    Urine culture    Pelvic pain in female        Abdominal bloating           P: rx for vicodin 7.5/750 #20 given without refill      Pelvic ultrasound- patient refused at this time- will schedule for next availible; to go to ED if pain worsens; to continue Motrin 842m as prescribed.

## 2014-12-06 ENCOUNTER — Telehealth: Payer: Self-pay | Admitting: Obstetrics and Gynecology

## 2014-12-06 NOTE — Telephone Encounter (Signed)
PT CALLED AND WANTED TO KNOW IF SHE COULD GET A REFILL ON HER PAIN MEDS. BUT SHE WOULD LIKE TO GO BACK TO [facility] AND SHE WANTS THIS ON ETO BE HER LAST ONE, SHE STATED SHE WANTS TO GET COMPLETLEY OFF OF THEM.

## 2014-12-06 NOTE — Telephone Encounter (Signed)
Please advise 

## 2014-12-07 ENCOUNTER — Emergency Department (HOSPITAL_COMMUNITY): Payer: Self-pay

## 2014-12-07 ENCOUNTER — Encounter (HOSPITAL_COMMUNITY): Payer: Self-pay | Admitting: Emergency Medicine

## 2014-12-07 ENCOUNTER — Emergency Department (HOSPITAL_COMMUNITY)
Admission: EM | Admit: 2014-12-07 | Discharge: 2014-12-07 | Disposition: A | Discharge status: Home / Self Care | Payer: Self-pay | Attending: Emergency Medicine | Admitting: Emergency Medicine

## 2014-12-07 DIAGNOSIS — R1031 Right lower quadrant pain: Secondary | ICD-10-CM | POA: Insufficient documentation

## 2014-12-07 DIAGNOSIS — Z3202 Encounter for pregnancy test, result negative: Secondary | ICD-10-CM | POA: Insufficient documentation

## 2014-12-07 DIAGNOSIS — Z8742 Personal history of other diseases of the female genital tract: Secondary | ICD-10-CM | POA: Insufficient documentation

## 2014-12-07 DIAGNOSIS — Z791 Long term (current) use of non-steroidal anti-inflammatories (NSAID): Secondary | ICD-10-CM | POA: Insufficient documentation

## 2014-12-07 DIAGNOSIS — R109 Unspecified abdominal pain: Secondary | ICD-10-CM

## 2014-12-07 DIAGNOSIS — Z8639 Personal history of other endocrine, nutritional and metabolic disease: Secondary | ICD-10-CM | POA: Insufficient documentation

## 2014-12-07 LAB — CBC WITH DIFFERENTIAL/PLATELET
Basophils Absolute: 0 10*3/uL (ref 0.0–0.1)
Basophils Relative: 1 % (ref 0–1)
Eosinophils Absolute: 0.2 10*3/uL (ref 0.0–0.7)
Eosinophils Relative: 4 % (ref 0–5)
HCT: 37.5 % (ref 36.0–46.0)
HEMOGLOBIN: 13.1 g/dL (ref 12.0–15.0)
LYMPHS ABS: 1.9 10*3/uL (ref 0.7–4.0)
LYMPHS PCT: 34 % (ref 12–46)
MCH: 30.5 pg (ref 26.0–34.0)
MCHC: 34.9 g/dL (ref 30.0–36.0)
MCV: 87.4 fL (ref 78.0–100.0)
MONOS PCT: 4 % (ref 3–12)
Monocytes Absolute: 0.2 10*3/uL (ref 0.1–1.0)
NEUTROS PCT: 57 % (ref 43–77)
Neutro Abs: 3.2 10*3/uL (ref 1.7–7.7)
Platelets: 214 10*3/uL (ref 150–400)
RBC: 4.29 MIL/uL (ref 3.87–5.11)
RDW: 11.6 % (ref 11.5–15.5)
WBC: 5.5 10*3/uL (ref 4.0–10.5)

## 2014-12-07 LAB — BASIC METABOLIC PANEL
Anion gap: 7 (ref 5–15)
BUN: 18 mg/dL (ref 6–20)
CO2: 26 mmol/L (ref 22–32)
Calcium: 9 mg/dL (ref 8.9–10.3)
Chloride: 104 mmol/L (ref 101–111)
Creatinine, Ser: 0.81 mg/dL (ref 0.44–1.00)
GFR calc Af Amer: >60 mL/min (ref >60)
Glucose, Bld: 99 mg/dL (ref 65–99)
Potassium: 3.7 mmol/L (ref 3.5–5.1)
Sodium: 137 mmol/L (ref 135–145)

## 2014-12-07 LAB — URINALYSIS, ROUTINE W REFLEX MICROSCOPIC
Bilirubin Urine: NEGATIVE
Glucose, UA: NEGATIVE mg/dL
KETONES UR: NEGATIVE mg/dL
Nitrite: NEGATIVE
Protein, ur: NEGATIVE mg/dL
Urobilinogen, UA: 0.2 mg/dL (ref 0.0–1.0)
pH: 6 (ref 5.0–8.0)

## 2014-12-07 LAB — URINE MICROSCOPIC-ADD ON

## 2014-12-07 LAB — PREGNANCY, URINE: PREG TEST UR: NEGATIVE

## 2014-12-07 MED ORDER — KETOROLAC TROMETHAMINE 30 MG/ML IJ SOLN
30.0000 mg | Freq: Once | INTRAMUSCULAR | Status: AC
  Administered 2014-12-07: 30 mg via INTRAVENOUS
  Filled 2014-12-07: qty 1

## 2014-12-07 MED ORDER — MORPHINE SULFATE 4 MG/ML IJ SOLN
4.0000 mg | Freq: Once | INTRAMUSCULAR | Status: AC
  Administered 2014-12-07: 4 mg via INTRAVENOUS
  Filled 2014-12-07: qty 1

## 2014-12-07 NOTE — Telephone Encounter (Signed)
Patient notified

## 2014-12-07 NOTE — Discharge Instructions (Signed)
Follow-up with your GYN if not improving in the next 2-3 days.  Ibuprofen 600 mg every 6 hours as needed for pain.   Abdominal Pain, Women Abdominal (stomach, pelvic, or belly) pain can be caused by many things. It is important to tell your doctor:  The location of the pain.  Does it come and go or is it present all the time?  Are there things that start the pain (eating certain foods, exercise)?  Are there other symptoms associated with the pain (fever, nausea, vomiting, diarrhea)? All of this is helpful to know when trying to find the cause of the pain. CAUSES   Stomach: virus or bacteria infection, or ulcer.  Intestine: appendicitis (inflamed appendix), regional ileitis (Crohn's disease), ulcerative colitis (inflamed colon), irritable bowel syndrome, diverticulitis (inflamed diverticulum of the colon), or cancer of the stomach or intestine.  Gallbladder disease or stones in the gallbladder.  Kidney disease, kidney stones, or infection.  Pancreas infection or cancer.  Fibromyalgia (pain disorder).  Diseases of the female organs:  Uterus: fibroid (non-cancerous) tumors or infection.  Fallopian tubes: infection or tubal pregnancy.  Ovary: cysts or tumors.  Pelvic adhesions (scar tissue).  Endometriosis (uterus lining tissue growing in the pelvis and on the pelvic organs).  Pelvic congestion syndrome (female organs filling up with blood just before the menstrual period).  Pain with the menstrual period.  Pain with ovulation (producing an egg).  Pain with an IUD (intrauterine device, birth control) in the uterus.  Cancer of the female organs.  Functional pain (pain not caused by a disease, may improve without treatment).  Psychological pain.  Depression. DIAGNOSIS  Your doctor will decide the seriousness of your pain by doing an examination.  Blood tests.  X-rays.  Ultrasound.  CT scan (computed tomography, special type of X-ray).  MRI (magnetic  resonance imaging).  Cultures, for infection.  Barium enema (dye inserted in the large intestine, to better view it with X-rays).  Colonoscopy (looking in intestine with a lighted tube).  Laparoscopy (minor surgery, looking in abdomen with a lighted tube).  Major abdominal exploratory surgery (looking in abdomen with a large incision). TREATMENT  The treatment will depend on the cause of the pain.   Many cases can be observed and treated at home.  Over-the-counter medicines recommended by your caregiver.  Prescription medicine.  Antibiotics, for infection.  Birth control pills, for painful periods or for ovulation pain.  Hormone treatment, for endometriosis.  Nerve blocking injections.  Physical therapy.  Antidepressants.  Counseling with a psychologist or psychiatrist.  Minor or major surgery. HOME CARE INSTRUCTIONS   Do not take laxatives, unless directed by your caregiver.  Take over-the-counter pain medicine only if ordered by your caregiver. Do not take aspirin because it can cause an upset stomach or bleeding.  Try a clear liquid diet (broth or water) as ordered by your caregiver. Slowly move to a bland diet, as tolerated, if the pain is related to the stomach or intestine.  Have a thermometer and take your temperature several times a day, and record it.  Bed rest and sleep, if it helps the pain.  Avoid sexual intercourse, if it causes pain.  Avoid stressful situations.  Keep your follow-up appointments and tests, as your caregiver orders.  If the pain does not go away with medicine or surgery, you may try:  Acupuncture.  Relaxation exercises (yoga, meditation).  Group therapy.  Counseling. SEEK MEDICAL CARE IF:   You notice certain foods cause stomach pain.  Your  home care treatment is not helping your pain.  You need stronger pain medicine.  You want your IUD removed.  You feel faint or lightheaded.  You develop nausea and  vomiting.  You develop a rash.  You are having side effects or an allergy to your medicine. SEEK IMMEDIATE MEDICAL CARE IF:   Your pain does not go away or gets worse.  You have a fever.  Your pain is felt only in portions of the abdomen. The right side could possibly be appendicitis. The left lower portion of the abdomen could be colitis or diverticulitis.  You are passing blood in your stools (bright red or black tarry stools, with or without vomiting).  You have blood in your urine.  You develop chills, with or without a fever.  You pass out. MAKE SURE YOU:   Understand these instructions.  Will watch your condition.  Will get help right away if you are not doing well or get worse. Document Released: 04/08/2007 Document Revised: 10/26/2013 Document Reviewed: 04/28/2009 [facility] Patient Information 2015 [place], [place]. This information is not intended to replace advice given to you by your health care provider. Make sure you discuss any questions you have with your health care provider.

## 2014-12-07 NOTE — ED Notes (Addendum)
Pt reports was diagnosed with 5cm ovarian cyst on right ovary. Pt reports right sided abdominal pain, vaginal bleeding. Pt denies gi/urinary symptoms.

## 2014-12-07 NOTE — Telephone Encounter (Signed)
No more pain meds to be prescribed- as she should not need them at this time.

## 2014-12-07 NOTE — Telephone Encounter (Signed)
Patient seen at [facility] ED today and released- told to call here to get pain medications- was informed that she would not be prescribed anymore pain medications at this time.

## 2014-12-07 NOTE — Telephone Encounter (Signed)
Pt was notified no more pain medication will be given out of this office

## 2014-12-07 NOTE — ED Provider Notes (Signed)
CSN: [id number]     Arrival date & time 12/07/14  1257 History   First MD Initiated Contact with Patient 12/07/14 1302     Chief Complaint  Patient presents with  . Abdominal Pain     (Consider location/radiation/quality/duration/timing/severity/associated sxs/prior Treatment) HPI Comments: Patient is a 29 year old female with history of prior thyroid surgery. She was recently diagnosed with an ovarian cyst at a [facility] care center in [place]. She presents here today complaining of increased pain and vaginal bleeding. She denies any fevers or chills. She denies any vaginal discharge. She denies any bowel or bladder complaints.  Patient is a 29 y.o. female presenting with abdominal pain. The history is provided by the patient.  Abdominal Pain Pain location:  RLQ Pain quality: cramping   Pain radiates to:  Does not radiate Pain severity:  Severe Onset quality:  Gradual Duration:  5 weeks Timing:  Constant Progression:  Worsening Chronicity:  New Relieved by:  Nothing Worsened by:  Nothing tried   Past Medical History  Diagnosis Date  . Thyroid disease   . Pelvic pain in female   . Ovarian cyst, right   . Irregular periods    Past Surgical History  Procedure Laterality Date  . Tonsillectomy    . Tubes in ears.    . Thyroid surgery     Family History  Problem Relation Age of Onset  . Adopted: Yes   History  Substance Use Topics  . Smoking status: Never Smoker   . Smokeless tobacco: Never Used  . Alcohol Use: No   OB History    No data available     Review of Systems  Gastrointestinal: Positive for abdominal pain.  All other systems reviewed and are negative.     Allergies  Diclofenac  Home Medications   Prior to Admission medications   Medication Sig Start Date End Date Taking? Authorizing Provider  acetaminophen (TYLENOL) 500 MG tablet Take 1,000 mg by mouth every 6 (six) hours as needed for mild pain or moderate pain.    Historical Provider, MD   ibuprofen (ADVIL,MOTRIN) 800 MG tablet Take 800 mg by mouth 3 (three) times daily. 11/19/14   Historical Provider, MD  oxyCODONE-acetaminophen (PERCOCET/ROXICET) 5-325 MG per tablet Take 1-2 tablets by mouth every 6 (six) hours as needed for severe pain. 11/25/14   Melody Valene Bors, CNM  traMADol (ULTRAM) 50 MG tablet Take 1 tablet (50 mg total) by mouth every 6 (six) hours as needed. 10/06/14   Evalee Jefferson, PA-C   BP 126/87 mmHg  Pulse 76  Temp(Src) 99.1 F (37.3 C) (Oral)  Resp 16  Ht 5\' 6"  (1.676 m)  Wt 135 lb (61.236 kg)  BMI 21.80 kg/m2  SpO2 100%  LMP 11/30/2014 (Exact Date) Physical Exam  Constitutional: She is oriented to person, place, and time. She appears well-developed and well-nourished. No distress.  HENT:  Head: Normocephalic and atraumatic.  Neck: Normal range of motion. Neck supple.  Cardiovascular: Normal rate and regular rhythm.  Exam reveals no gallop and no friction rub.   No murmur heard. Pulmonary/Chest: Effort normal and breath sounds normal. No respiratory distress. She has no wheezes.  Abdominal: Soft. Bowel sounds are normal. She exhibits no distension. There is tenderness. There is no rebound and no guarding.  There is tenderness to palpation in the right lower quadrant and suprapubic region.  Musculoskeletal: Normal range of motion.  Neurological: She is alert and oriented to person, place, and time.  Skin: Skin is warm and  dry. She is not diaphoretic.  Nursing note and vitals reviewed.   ED Course  Procedures (including critical care time) Labs Review Labs Reviewed  BASIC METABOLIC PANEL  CBC WITH DIFFERENTIAL/PLATELET  URINALYSIS, ROUTINE W REFLEX MICROSCOPIC (NOT AT [facility])  PREGNANCY, URINE    Imaging Review No results found.   EKG Interpretation None      MDM   Final diagnoses:  None    Patient is a 29 year old female who presents with right lower quadrant pain and stating that she was diagnosed with an ovarian cyst 5 weeks ago. This was  an outside facility and I am unable to obtain these records. Today's ultrasound reveals no abnormalities. There is no ovarian cyst and no evidence for free fluid that would suggest rupture or hemorrhage. She also has no fever and no white count that would be suggestive of appendicitis. She was given pain medicine in the ER and is now feeling better. Upon reviewing her prescription filling habits on the [place], she has had a total of 80 hydrocodone or oxycodone in the past 2 weeks and I am somewhat uncomfortable prescribing more. She needs to follow-up with her GYN for further prescriptions and diagnostic exams.    Veryl Speak, MD 12/07/14 [phone]

## 2014-12-08 NOTE — Telephone Encounter (Signed)
Pt aware.

## 2014-12-10 ENCOUNTER — Ambulatory Visit: Payer: Self-pay | Admitting: Obstetrics and Gynecology

## 2014-12-10 ENCOUNTER — Other Ambulatory Visit: Payer: Self-pay

## 2014-12-31 ENCOUNTER — Other Ambulatory Visit: Payer: Self-pay

## 2014-12-31 ENCOUNTER — Ambulatory Visit: Payer: Self-pay | Admitting: Obstetrics and Gynecology

## 2015-01-14 ENCOUNTER — Other Ambulatory Visit: Payer: Self-pay

## 2015-01-14 ENCOUNTER — Ambulatory Visit: Payer: Self-pay | Admitting: Obstetrics and Gynecology

## 2015-02-18 ENCOUNTER — Encounter: Payer: Self-pay | Admitting: Family Medicine

## 2015-02-18 ENCOUNTER — Ambulatory Visit (INDEPENDENT_AMBULATORY_CARE_PROVIDER_SITE_OTHER): Payer: Self-pay | Admitting: Family Medicine

## 2015-02-18 VITALS — BP 112/80 | HR 97 | Temp 98.0°F | Resp 16 | Wt 137.1 lb

## 2015-02-18 DIAGNOSIS — M542 Cervicalgia: Secondary | ICD-10-CM | POA: Insufficient documentation

## 2015-02-18 DIAGNOSIS — N926 Irregular menstruation, unspecified: Secondary | ICD-10-CM | POA: Insufficient documentation

## 2015-02-18 DIAGNOSIS — E049 Nontoxic goiter, unspecified: Secondary | ICD-10-CM

## 2015-02-18 MED ORDER — OXYCODONE-ACETAMINOPHEN 5-325 MG PO TABS: 1.0000 | ORAL_TABLET | Freq: Two times a day (BID) | ORAL | Status: DC | PRN

## 2015-02-18 NOTE — Progress Notes (Signed)
Name: Anne Hamilton   MRN: [id number]    DOB: 1985/07/28   Date:02/18/2015       Progress Note  Subjective  Chief Complaint  Chief Complaint  Patient presents with  . Establish Care  . Thyroid Problem    patient had 1/2 of her thyroid removed about 7 yrs ago, but now has swelling on the left side of her neck with pain.    HPI  Anne Hamilton is a 29 year old female who is here to establish care and discuss her neck pain and goiter. She is adopted and does not know much of her biological family history but does report she had learned her mother had thyroid issues. Personally she had thyroid nodules in the right thyroid lobe which was removed in 2009. She has not had any symptoms of choking, swallowing difficulty, hoarseness, palpitations, hair or nail changes, constipation, mood changes. There is pain involved with the goiter radiating up to her left anterior neck. She has tried ibuprofen and tramadol with minimal relief and is requesting percocet at bedtime so she can sleep. Anne Hamilton does report a history of abnormal menses, occuring at unpredictable intervals 30-60 days apart. She is followed by [facility].   Social History  Substance Use Topics  . Smoking status: Former Research scientist (life sciences)  . Smokeless tobacco: Never Used     Comment: patient states she quit 37yr ago.  . Alcohol Use: No     Comment: rarely     Current outpatient prescriptions:  .  acetaminophen (TYLENOL) 500 MG tablet, Take 1,000 mg by mouth every 6 (six) hours as needed for mild pain or moderate pain., Disp: , Rfl:  .  ibuprofen (ADVIL,MOTRIN) 200 MG tablet, Take 600-800 mg by mouth every 6 (six) hours as needed for moderate pain., Disp: , Rfl:  .  oxyCODONE-acetaminophen (ROXICET) 5-325 MG per tablet, Take 1 tablet by mouth 3 times/day as needed-between meals & bedtime for severe pain., Disp: 30 tablet, Rfl: 0  Past Surgical History  Procedure Laterality Date  . Tonsillectomy    . Tubes in ears.    . Thyroid surgery       Family History  Problem Relation Age of Onset  . Adopted: Yes    Allergies  Allergen Reactions  . Diclofenac Rash     Review of Systems  CONSTITUTIONAL: No significant weight changes, fever, chills, weakness or fatigue.  HEENT:  - Eyes: No visual changes.  - Ears: No auditory changes. No pain.  - Nose: No sneezing, congestion, runny nose. - Throat: No sore throat. No changes in swallowing. Enlarging thyroid gland. SKIN: No rash or itching.  CARDIOVASCULAR: No chest pain, chest pressure or chest discomfort. No palpitations or edema.  RESPIRATORY: No shortness of breath, cough or sputum.  GASTROINTESTINAL: No anorexia, nausea, vomiting. No changes in bowel habits. No abdominal pain or blood.  GENITOURINARY: No dysuria. No frequency. No discharge. NEUROLOGICAL: No headache, dizziness, syncope, paralysis, ataxia, numbness or tingling in the extremities. No memory changes. No change in bowel or bladder control.  MUSCULOSKELETAL: No joint pain. No muscle pain. HEMATOLOGIC: No anemia, bleeding or bruising.  LYMPHATICS: No enlarged lymph nodes.  PSYCHIATRIC: No change in mood. No change in sleep pattern.  ENDOCRINOLOGIC: No reports of sweating, cold or heat intolerance. No polyuria or polydipsia.     Objective  BP 112/80 mmHg  Pulse 97  Temp(Src) 98 F (36.7 C) (Oral)  Resp 16  Wt 137 lb 1.6 oz (62.188 kg)  SpO2 96%  LMP 01/18/2015 (Approximate) Body mass index is 22.14 kg/(m^2).  Physical Exam  Constitutional: Patient appears well-developed and well-nourished. In no distress.  HEENT:  - Head: Normocephalic and atraumatic.  - Ears: Bilateral TMs gray, no erythema or effusion - Nose: Nasal mucosa moist - Mouth/Throat: Oropharynx is clear and moist. No tonsillar hypertrophy or erythema. No post nasal drainage.  - Eyes: Conjunctivae clear, EOM movements normal. PERRLA. No scleral icterus.  Neck: Normal range of motion. Neck supple. No JVD present. Goiter nodular  left side prominent but not tender. Cardiovascular: Normal rate, regular rhythm and normal heart sounds.  No murmur heard.  Pulmonary/Chest: Effort normal and breath sounds normal. No respiratory distress. Musculoskeletal: Normal range of motion bilateral UE and LE, no joint effusions. Peripheral vascular: Bilateral LE no edema. Neurological: CN II-XII grossly intact with no focal deficits. Alert and oriented to person, place, and time. Coordination, balance, strength, speech and gait are normal.  Skin: Skin is warm and dry. No rash noted. No erythema.  Psychiatric: Patient has a normal mood and affect. Behavior is normal in office today. Judgment and thought content normal in office today.   Recent Results (from the past 2160 hour(s))  POCT urinalysis dipstick     Status: Abnormal   Collection Time: 12/01/14 10:20 AM  Result Value Ref Range   Color, UA dark yellow    Clarity, UA clear    Glucose, UA neg    Bilirubin, UA neg    Ketones, UA neg    Spec Grav, UA 1.025    Blood, UA large    pH, UA 5.0    Protein, UA 30    Urobilinogen, UA 0.2    Nitrite, UA neg    Leukocytes, UA Negative   Urinalysis, Routine w reflex microscopic (not at [facility])     Status: Abnormal   Collection Time: 12/07/14  1:20 PM  Result Value Ref Range   Color, Urine YELLOW YELLOW   APPearance CLEAR CLEAR   Specific Gravity, Urine >1.030 (H) 1.005 - 1.030   pH 6.0 5.0 - 8.0   Glucose, UA NEGATIVE NEGATIVE mg/dL   Hgb urine dipstick LARGE (A) NEGATIVE   Bilirubin Urine NEGATIVE NEGATIVE   Ketones, ur NEGATIVE NEGATIVE mg/dL   Protein, ur NEGATIVE NEGATIVE mg/dL   Urobilinogen, UA 0.2 0.0 - 1.0 mg/dL   Nitrite NEGATIVE NEGATIVE   Leukocytes, UA TRACE (A) NEGATIVE  Pregnancy, urine     Status: None   Collection Time: 12/07/14  1:20 PM  Result Value Ref Range   Preg Test, Ur NEGATIVE NEGATIVE  Urine microscopic-add on     Status: Abnormal   Collection Time: 12/07/14  1:20 PM  Result Value Ref Range    WBC, UA 3-6 <3 WBC/hpf   RBC / HPF 11-20 <3 RBC/hpf   Bacteria, UA MANY (A) RARE  Basic metabolic panel     Status: None   Collection Time: 12/07/14  1:25 PM  Result Value Ref Range   Sodium 137 135 - 145 mmol/L   Potassium 3.7 3.5 - 5.1 mmol/L   Chloride 104 101 - 111 mmol/L   CO2 26 22 - 32 mmol/L   Glucose, Bld 99 65 - 99 mg/dL   BUN 18 6 - 20 mg/dL   Creatinine, Ser 0.81 0.44 - 1.00 mg/dL   Calcium 9.0 8.9 - 10.3 mg/dL   GFR calc non Af Amer >60 >60 mL/min   GFR calc Af Amer >60 >60 mL/min    Comment: (NOTE)  The eGFR has been calculated using the CKD EPI equation. This calculation has not been validated in all clinical situations. eGFR's persistently <60 mL/min signify possible Chronic Kidney Disease.    Anion gap 7 5 - 15  CBC with Differential     Status: None   Collection Time: 12/07/14  1:25 PM  Result Value Ref Range   WBC 5.5 4.0 - 10.5 K/uL   RBC 4.29 3.87 - 5.11 MIL/uL   Hemoglobin 13.1 12.0 - 15.0 g/dL   HCT 37.5 36.0 - 46.0 %   MCV 87.4 78.0 - 100.0 fL   MCH 30.5 26.0 - 34.0 pg   MCHC 34.9 30.0 - 36.0 g/dL   RDW 11.6 11.5 - 15.5 %   Platelets 214 150 - 400 K/uL   Neutrophils Relative % 57 43 - 77 %   Neutro Abs 3.2 1.7 - 7.7 K/uL   Lymphocytes Relative 34 12 - 46 %   Lymphs Abs 1.9 0.7 - 4.0 K/uL   Monocytes Relative 4 3 - 12 %   Monocytes Absolute 0.2 0.1 - 1.0 K/uL   Eosinophils Relative 4 0 - 5 %   Eosinophils Absolute 0.2 0.0 - 0.7 K/uL   Basophils Relative 1 0 - 1 %   Basophils Absolute 0.0 0.0 - 0.1 K/uL     Assessment & Plan  1. Thyroid goiter Will get thyroid panel and antibody testing. 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  Referral to Endocrinology placed.  - oxyCODONE-acetaminophen (ROXICET) 5-325 MG per tablet; Take 1 tablet by mouth 3 times/day as needed-between meals & bedtime for severe pain.  Dispense: 30 tablet; Refill: 0 - TSH - T3, free - T4, free - Thyroglobulin antibody - Thyroid  peroxidase antibody - Thyroid stimulating immunoglobulin - Ambulatory referral to Endocrinology  2. Anterior neck pain  - oxyCODONE-acetaminophen (ROXICET) 5-325 MG per tablet; Take 1 tablet by mouth 3 times/day as needed-between meals & bedtime for severe pain.  Dispense: 30 tablet; Refill: 0

## 2015-02-18 NOTE — Patient Instructions (Signed)
Goiter Goiter is an enlarged thyroid gland. The thyroid gland sits at the base of the front of the neck. The gland produces hormones that regulate mood, body temperature, pulse rate, and digestion. Most goiters are painless and are not a cause for serious concern. Goiters and conditions that cause goiters can be treated if necessary.  CAUSES  Common causes of goiter include:  Graves disease (causes too much hormone to be produced [hyperthyroidism]).  Hashimoto disease (causes too little hormone to be produced [hypothyroidism]).  Thyroiditis (inflammation of the thyroid sometimes caused by virus or pregnancy).  Nodular goiter (small bumps form; sometimes called toxic nodular goiter).  Pregnancy.  Thyroid cancer (very few goiters with nodules are cancerous).  Certain medications.  Radiation exposure.  Iodine deficiency (more common in developing countries in inland populations). RISK FACTORS Risk factors for goiter include:  A family history of goiter.  Female gender.  Inadequate iodine in the diet.  Age older than 25 years. SYMPTOMS  Many goiters do not cause symptoms. When symptoms do occur, they may include:  Swelling in the lower part of the neck. This swelling can range from a very small bump to a large lump.  A tight feeling in the throat.  A hoarse voice. Less commonly, a goiter may result in:  Coughing.  Wheezing.  Difficulty swallowing.  Difficulty breathing.  Bulging neck veins.  Dizziness. When a goiter is the result of hyperthyroidism, symptoms may include:  Rapid or irregular heartbeat.  Sickness in your stomach (nausea).  Vomiting.  Diarrhea.  Shaking.  Irritable feeling.  Bulging eyes.  Weight loss.  Heat sensitivity.  Anxiety. When a goiter is the result of hypothyroidism, symptoms may include:  Tiredness.  Dry skin.  Constipation.  Weight gain.  Irregular menstrual cycle.  Depressed mood.  Sensitivity to  cold. DIAGNOSIS  Tests used to diagnose goiter include:  A physical exam.  Blood tests, including thyroid hormone levels and antibody testing.  Ultrasonography, computerized X-ray scan (computed tomography, CT) or computerized magnetic scan (magnetic resonance imaging, MRI).  Thyroid scan (imaging along with safe radioactive injection).  Tissue sample taken (biopsy) of nodules. This is sometimes done to confirm that the nodules are not cancerous. TREATMENT  Treatment will depend on the cause of the goiter. Treatment may include:  Monitoring. In some cases, no treatment is necessary, and your doctor will monitor your condition at regular checkups.  Medications and supplements. Thyroid medication (thyroid hormone replacement) is available for hyperthyroidism and hypothyroidism.  If inflammation is the cause, over-the-counter medication or steroid medication may be recommended.  Goiters caused by iodine deficiency can be treated with iodine supplements or changes in diet.  Radioactive iodine treatment. Radioactive iodine is injected into the blood. It travels to the thyroid gland, kills thyroid cells, and reduces the size of the gland. This is only used when the thyroid gland is overactive. Lifelong thyroid hormone medication is often necessary after this treatment.  Surgery. A procedure to remove all or part of the gland may be recommended in severe cases or when cancer is the cause. Hormones can be taken to replace the hormones normally produced by the thyroid. HOME CARE INSTRUCTIONS   Take medications as directed.  Follow your caregiver's recommendations for any dietary changes.  Follow up with your caregiver for further examination and testing, as directed. PREVENTION   If you have a family history of goiter, discuss screening with your doctor.  Make sure you are getting enough iodine in your diet.  Use  of iodized table salt can help prevent iodine deficiency. Document  Released: 11/29/2009 Document Revised: 10/26/2013 Document Reviewed: 11/29/2009 [facility] Patient Information 2015 [place], [place]. This information is not intended to replace advice given to you by your health care provider. Make sure you discuss any questions you have with your health care provider.

## 2015-02-21 ENCOUNTER — Telehealth: Payer: Self-pay

## 2015-02-21 ENCOUNTER — Telehealth: Payer: Self-pay | Admitting: Family Medicine

## 2015-02-21 NOTE — Telephone Encounter (Signed)
Patient stated that she has been feeling a little funny while taking the Roxicet, so she called Melody Trudee Kuster and she told her that she should probably try the Hydrocodone (10mg ) since she did well on that before.  She is asking that you send it to [facility] on [address].  Cell # [phone]

## 2015-02-21 NOTE — Telephone Encounter (Signed)
See my message below 

## 2015-02-21 NOTE — Telephone Encounter (Signed)
No further narcotic pain medication will be provided by me. She will have to see the Endocrinologist and determine treatment options for her goiter as the best solution for her pain. If another provider feels free to recommend what medications I should be prescribing then the patient can also feel free to ask that same provider to give them the prescription. Thank you.

## 2015-02-21 NOTE — Telephone Encounter (Signed)
Patient returned my call and I informed her of the message below, but she asked if Dr. Nadine Counts could just give her a few pills (hydrocodone 10/325) until she could be seen by the endocrinologist. She stated, "please let her know that I will not make it a habit to do this," I just needed something to help with the pain for now. I told her that I will send a message back to her but that I could not make any guarantees and that she should try to reach out to Melody since she has prescribed it to her before.

## 2015-02-21 NOTE — Telephone Encounter (Signed)
Tried to contact this patient so I could relay the message below, but there was no answer. A message was left for this patient to give [place] a call back when she got the chance.

## 2015-02-21 NOTE — Telephone Encounter (Signed)
Anne Hamilton was referred to Dr Graceann Congress at [facility]. They are requesting labs, med list, and insurance info to be faxed to [phone] ATTN: Pamala Hurry

## 2015-02-22 NOTE — Telephone Encounter (Signed)
Notified Pamala Hurry patient is self pay, labs are pending and I have faxed the med list

## 2015-02-23 NOTE — Telephone Encounter (Signed)
Patient was informed of Dr. Allie Dimmer recommendations

## 2015-02-25 ENCOUNTER — Emergency Department: Payer: Self-pay

## 2015-02-25 ENCOUNTER — Emergency Department
Admission: EM | Admit: 2015-02-25 | Discharge: 2015-02-25 | Disposition: A | Discharge status: Home / Self Care | Payer: Self-pay | Attending: Emergency Medicine | Admitting: Emergency Medicine

## 2015-02-25 DIAGNOSIS — Z87891 Personal history of nicotine dependence: Secondary | ICD-10-CM | POA: Insufficient documentation

## 2015-02-25 DIAGNOSIS — E049 Nontoxic goiter, unspecified: Secondary | ICD-10-CM | POA: Insufficient documentation

## 2015-02-25 DIAGNOSIS — Z3202 Encounter for pregnancy test, result negative: Secondary | ICD-10-CM | POA: Insufficient documentation

## 2015-02-25 HISTORY — DX: Malignant neoplasm of thyroid gland: C73

## 2015-02-25 LAB — CBC WITH DIFFERENTIAL/PLATELET
BASOS ABS: 0 10*3/uL (ref 0–0.1)
Basophils Relative: 1 %
EOS PCT: 5 %
Eosinophils Absolute: 0.3 10*3/uL (ref 0–0.7)
HCT: 39.5 % (ref 35.0–47.0)
Hemoglobin: 13.3 g/dL (ref 12.0–16.0)
LYMPHS ABS: 2 10*3/uL (ref 1.0–3.6)
LYMPHS PCT: 40 %
MCH: 29.6 pg (ref 26.0–34.0)
MCHC: 33.6 g/dL (ref 32.0–36.0)
MCV: 88.1 fL (ref 80.0–100.0)
MONO ABS: 0.3 10*3/uL (ref 0.2–0.9)
Monocytes Relative: 6 %
Neutro Abs: 2.4 10*3/uL (ref 1.4–6.5)
Neutrophils Relative %: 48 %
PLATELETS: 191 10*3/uL (ref 150–440)
RBC: 4.48 MIL/uL (ref 3.80–5.20)
RDW: 13.5 % (ref 11.5–14.5)
WBC: 5 10*3/uL (ref 3.6–11.0)

## 2015-02-25 LAB — BASIC METABOLIC PANEL
ANION GAP: 8 (ref 5–15)
BUN: 19 mg/dL (ref 6–20)
CO2: 28 mmol/L (ref 22–32)
Calcium: 9.5 mg/dL (ref 8.9–10.3)
Chloride: 103 mmol/L (ref 101–111)
Creatinine, Ser: 0.86 mg/dL (ref 0.44–1.00)
GFR calc Af Amer: >60 mL/min (ref >60)
GFR calc non Af Amer: >60 mL/min (ref >60)
Glucose, Bld: 103 mg/dL — ABNORMAL HIGH (ref 65–99)
POTASSIUM: 2.9 mmol/L — AB (ref 3.5–5.1)
SODIUM: 139 mmol/L (ref 135–145)

## 2015-02-25 LAB — TSH: TSH: 12.458 u[IU]/mL — ABNORMAL HIGH (ref 0.350–4.500)

## 2015-02-25 LAB — T4, FREE: FREE T4: 0.62 ng/dL (ref 0.61–1.12)

## 2015-02-25 MED ORDER — IBUPROFEN 600 MG PO TABS
600.0000 mg | ORAL_TABLET | ORAL | Status: AC
Start: 2015-02-25 — End: 2015-02-25
  Administered 2015-02-25: 600 mg via ORAL
  Filled 2015-02-25: qty 1

## 2015-02-25 MED ORDER — LEVOTHYROXINE SODIUM 100 MCG PO TABS: 100.0000 ug | ORAL_TABLET | Freq: Every day | ORAL | Status: AC

## 2015-02-25 MED ORDER — IOHEXOL 300 MG/ML  SOLN
75.0000 mL | Freq: Once | INTRAMUSCULAR | Status: AC | PRN
  Administered 2015-02-25: 75 mL via INTRAVENOUS

## 2015-02-25 MED ORDER — MORPHINE SULFATE (PF) 4 MG/ML IV SOLN
4.0000 mg | Freq: Once | INTRAVENOUS | Status: AC
  Administered 2015-02-25: 4 mg via INTRAVENOUS
  Filled 2015-02-25: qty 1

## 2015-02-25 MED ORDER — HYDROCODONE-ACETAMINOPHEN 5-325 MG PO TABS
2.0000 | ORAL_TABLET | Freq: Once | ORAL | Status: AC
  Administered 2015-02-25: 2 via ORAL
  Filled 2015-02-25: qty 2

## 2015-02-25 MED ORDER — POTASSIUM CHLORIDE CRYS ER 20 MEQ PO TBCR
40.0000 meq | EXTENDED_RELEASE_TABLET | Freq: Once | ORAL | Status: AC
  Administered 2015-02-25: 40 meq via ORAL
  Filled 2015-02-25: qty 2

## 2015-02-25 MED ORDER — HYDROCODONE-ACETAMINOPHEN 5-325 MG PO TABS: 1.0000 | ORAL_TABLET | Freq: Four times a day (QID) | ORAL | Status: DC | PRN

## 2015-02-25 NOTE — ED Notes (Signed)
POCT Urine pregnancy- NEGATIVE.

## 2015-02-25 NOTE — ED Provider Notes (Signed)
[facility] Emergency Department Provider Note REMINDER - THIS NOTE IS NOT A FINAL MEDICAL RECORD UNTIL IT IS SIGNED. UNTIL THEN, THE CONTENT BELOW MAY REFLECT INFORMATION FROM A DOCUMENTATION TEMPLATE, NOT THE ACTUAL PATIENT VISIT. ____________________________________________  Time seen: Approximately 10:17 AM  I have reviewed the triage vital signs and the nursing notes.   HISTORY  Chief Complaint Neck Pain    HPI Anne Hamilton is a 29 y.o. female history of previous thyroid cancer with thyroidectomy and radiation therapy. This was done in [place]. She reports that for the last 2 weeks she's had pain in the left side of her throat, a hurts when she swallows. No trouble breathing. No wheezing. She is able to swallow. reports that the left lower part of her neck seems slightly swollen and tender over the area of her thyroid which was previously partially resected.  No noted fever. No nausea or vomiting. No chest pain. No headache.   Past Medical History  Diagnosis Date  . Thyroid disease   . Pelvic pain in female   . Ovarian cyst, right   . Irregular periods   . Thyroid cancer     Patient Active Problem List   Diagnosis Date Noted  . Thyroid goiter 02/18/2015  . Anterior neck pain 02/18/2015  . Irregular menses 02/18/2015    Past Surgical History  Procedure Laterality Date  . Tonsillectomy    . Tubes in ears.    . Thyroid surgery      Current Outpatient Rx  Name  Route  Sig  Dispense  Refill  . acetaminophen (TYLENOL) 500 MG tablet   Oral   Take 1,000 mg by mouth every 6 (six) hours as needed for mild pain or moderate pain.         Marland Kitchen ibuprofen (ADVIL,MOTRIN) 200 MG tablet   Oral   Take 600-800 mg by mouth every 6 (six) hours as needed for moderate pain.         Marland Kitchen HYDROcodone-acetaminophen (NORCO/VICODIN) 5-325 MG per tablet   Oral   Take 1 tablet by mouth every 6 (six) hours as needed for moderate pain.   15 tablet   0   .  levothyroxine (SYNTHROID) 100 MCG tablet   Oral   Take 1 tablet (100 mcg total) by mouth daily.   30 tablet   0   . oxyCODONE-acetaminophen (ROXICET) 5-325 MG per tablet   Oral   Take 1 tablet by mouth 3 times/day as needed-between meals & bedtime for severe pain.   30 tablet   0     Refill 02/18/15     Allergies Diclofenac  Family History  Problem Relation Age of Onset  . Adopted: Yes    Social History Social History  Substance Use Topics  . Smoking status: Former Research scientist (life sciences)  . Smokeless tobacco: Never Used     Comment: patient states she quit 81yrs ago.  . Alcohol Use: No     Comment: rarely    Review of Systems Constitutional: No fever/chills Eyes: No visual changes. ENT: See history of present illness chest describes the pain is not really in the upper part of her throat but more along the left mid and lower left neck. Cardiovascular: Denies chest pain. Respiratory: Denies shortness of breath. Gastrointestinal: No abdominal pain.  No nausea, no vomiting.  No diarrhea.  No constipation. Genitourinary: Negative for dysuria. Musculoskeletal: Negative for back pain. Skin: Negative for rash. Neurological: Negative for headaches, focal weakness or numbness.  Patient  is not pregnant, reports she is a lesbian.  10-point ROS otherwise negative.  ____________________________________________   PHYSICAL EXAM:  VITAL SIGNS: ED Triage Vitals  Enc Vitals Group     BP 02/25/15 0928 144/98 mmHg     Pulse Rate 02/25/15 0928 72     Resp 02/25/15 0928 18     Temp 02/25/15 0928 97.7 F (36.5 C)     Temp Source 02/25/15 0928 Oral     SpO2 02/25/15 0928 100 %     Weight 02/25/15 0928 137 lb (62.143 kg)     Height 02/25/15 0928 5\' 6"  (1.676 m)     Head Cir --      Peak Flow --      Pain Score 02/25/15 0928 7     Pain Loc --      Pain Edu? --      Excl. in [place]? --    Constitutional: Alert and oriented. Well appearing and in no acute distress. Eyes: Conjunctivae are  normal. PERRL. EOMI. Head: Atraumatic. Nose: No congestion/rhinnorhea. Mouth/Throat: Mucous membranes are moist.  Oropharynx non-erythematous. There is no evidence of obstruction. No edema or swelling in the upper airway visualized. Neck: No stridor.  No obvious adenopathy. There is tenderness over the left mid and inferior neck, worsened by swallowing and suspicious for tenderness over the side of the thyroid region. There is no erythema or redness. There is minimal swelling in this region. There is a slight appearance of fullness to the left versus the right anterior neck. Cardiovascular: Normal rate, regular rhythm. Grossly normal heart sounds.  Good peripheral circulation. Respiratory: Normal respiratory effort.  No retractions. Lungs CTAB. Gastrointestinal: Soft and nontender. No distention. No abdominal bruits. No CVA tenderness. Musculoskeletal: No lower extremity tenderness nor edema.  No joint effusions. Neurologic:  Normal speech and language. No gross focal neurologic deficits are appreciated. No gait instability. Skin:  Skin is warm, dry and intact. No rash noted. Psychiatric: Mood and affect are normal. Speech and behavior are normal.  ____________________________________________   LABS (all labs ordered are listed, but only abnormal results are displayed)  Labs Reviewed  BASIC METABOLIC PANEL - Abnormal; Notable for the following:    Potassium 2.9 (*)    Glucose, Bld 103 (*)    All other components within normal limits  TSH - Abnormal; Notable for the following:    TSH 12.458 (*)    All other components within normal limits  T4, FREE  CBC WITH DIFFERENTIAL/PLATELET  POC URINE PREG, ED   ____________________________________________  EKG   ____________________________________________  RADIOLOGY  IMPRESSION: 1. Palpable abnormality corresponds to the left thyroid lobe which appears enlarged and heterogeneous without a dominant nodule or lesion. The right lobe  appears surgically absent in this patient who gives a history of thyroid cancer. 2. Subcentimeter but conspicuous left level 4 and level 3 lymph nodes are indeterminate in this setting. 3. Study discussed by telephone with Dr. Delman Kitten on 02/25/2015 at 11:58 . We discussed prompt follow-up with endocrinology plus review of the patient records from [place] to clarify her thyroid history, and hopefully to include prior imaging studies which could be compared with this exam. ____________________________________________   PROCEDURES  Procedure(s) performed: None  Critical Care performed: No  ____________________________________________   INITIAL IMPRESSION / ASSESSMENT AND PLAN / ED COURSE  Pertinent labs & imaging results that were available during my care of the patient were reviewed by me and considered in my medical decision making (see chart  for details).  Patient presents with left sided neck discomfort and slight fullness in the history of thyroid cancer previously. Symptoms are concerning for the possibility of mass versus infection versus recurrence of cancer. There is no evidence of acute airway compromise based on my examination, we will obtain CT imaging. She is slightly tender over the left anterior neck in the region of where the thyroid would have been, I will send basic labs obtain a CT scan and then evaluate further thereafter.  ----------------------------------------- 12:17 PM on 02/25/2015 -----------------------------------------  CT findings reviewed with patient, radiologist and also with endocrinology. Based upon the patient's previous history, I discussed with endocrinology (Dr. Graceann Congress) who will come and see the patient in consultation emergency room. There is no evidence of airway compromise, trachea is slightly shifted to the right per discussion with radiology but no encroachment upon the airway itself. Patient shows no signs of respiratory distress. I  have seen by endocrinology, anticipate likely outpatient close follow-up and workup. No evidence of issue such as thyroid storm in the emergency room.  ----------------------------------------- 2:19 PM on 02/25/2015 -----------------------------------------  Patient in consult by endocrinology advises close follow-up on September 7 which is scheduled the patient will go to. In addition initiate levothyroxine at 100 g daily.  I will prescribe the patient a narcotic pain medicine due to their condition which I anticipate will cause at least moderate pain short term. I discussed with the patient safe use of narcotic pain medicines, and that they are not to drive, work in dangerous areas, or ever take more than prescribed (no more than 1 pill every 6 hours). We discussed that this is the type of medication that "Alfonse Spruce" may have overdosed on and the risks of this type of medicine. Patient is very agreeable to only use as prescribed and to never use more than prescribed.  Patient is aware that this mass may (not affirmed) represent thyroid cancer recurrence, and she will follow up very closely.  ____________________________________________   FINAL CLINICAL IMPRESSION(S) / ED DIAGNOSES  Final diagnoses:  Enlarged thyroid      Delman Kitten, MD 02/25/15 1420

## 2015-02-25 NOTE — Discharge Instructions (Signed)
Goiter  Please follow-up with endocrinology on September 7 as scheduled. This is extremely important.  Return emergency room right away if you develop a high fever, difficulty swallowing, you have trouble breathing, developed increased swelling, a rapid heart rate, feels shaky or other new concerns arise.  Goiter is an enlarged thyroid gland. The thyroid gland sits at the base of the front of the neck. The gland produces hormones that regulate mood, body temperature, pulse rate, and digestion. Most goiters are painless and are not a cause for serious concern. Goiters and conditions that cause goiters can be treated if necessary.  CAUSES  Common causes of goiter include:  Graves disease (causes too much hormone to be produced [hyperthyroidism]).  Hashimoto disease (causes too little hormone to be produced [hypothyroidism]).  Thyroiditis (inflammation of the thyroid sometimes caused by virus or pregnancy).  Nodular goiter (small bumps form; sometimes called toxic nodular goiter).  Pregnancy.  Thyroid cancer (very few goiters with nodules are cancerous).  Certain medications.  Radiation exposure.  Iodine deficiency (more common in developing countries in inland populations). RISK FACTORS Risk factors for goiter include:  A family history of goiter.  Female gender.  Inadequate iodine in the diet.  Age older than 82 years. SYMPTOMS  Many goiters do not cause symptoms. When symptoms do occur, they may include:  Swelling in the lower part of the neck. This swelling can range from a very small bump to a large lump.  A tight feeling in the throat.  A hoarse voice. Less commonly, a goiter may result in:  Coughing.  Wheezing.  Difficulty swallowing.  Difficulty breathing.  Bulging neck veins.  Dizziness. When a goiter is the result of hyperthyroidism, symptoms may include:  Rapid or irregular heartbeat.  Sickness in your stomach  (nausea).  Vomiting.  Diarrhea.  Shaking.  Irritable feeling.  Bulging eyes.  Weight loss.  Heat sensitivity.  Anxiety. When a goiter is the result of hypothyroidism, symptoms may include:  Tiredness.  Dry skin.  Constipation.  Weight gain.  Irregular menstrual cycle.  Depressed mood.  Sensitivity to cold. DIAGNOSIS  Tests used to diagnose goiter include:  A physical exam.  Blood tests, including thyroid hormone levels and antibody testing.  Ultrasonography, computerized X-ray scan (computed tomography, CT) or computerized magnetic scan (magnetic resonance imaging, MRI).  Thyroid scan (imaging along with safe radioactive injection).  Tissue sample taken (biopsy) of nodules. This is sometimes done to confirm that the nodules are not cancerous. TREATMENT  Treatment will depend on the cause of the goiter. Treatment may include:  Monitoring. In some cases, no treatment is necessary, and your doctor will monitor your condition at regular checkups.  Medications and supplements. Thyroid medication (thyroid hormone replacement) is available for hyperthyroidism and hypothyroidism.  If inflammation is the cause, over-the-counter medication or steroid medication may be recommended.  Goiters caused by iodine deficiency can be treated with iodine supplements or changes in diet.  Radioactive iodine treatment. Radioactive iodine is injected into the blood. It travels to the thyroid gland, kills thyroid cells, and reduces the size of the gland. This is only used when the thyroid gland is overactive. Lifelong thyroid hormone medication is often necessary after this treatment.  Surgery. A procedure to remove all or part of the gland may be recommended in severe cases or when cancer is the cause. Hormones can be taken to replace the hormones normally produced by the thyroid. HOME CARE INSTRUCTIONS   Take medications as directed.  Follow your caregiver's  recommendations for  any dietary changes.  Follow up with your caregiver for further examination and testing, as directed. PREVENTION   If you have a family history of goiter, discuss screening with your doctor.  Make sure you are getting enough iodine in your diet.  Use of iodized table salt can help prevent iodine deficiency. Document Released: 11/29/2009 Document Revised: 10/26/2013 Document Reviewed: 11/29/2009 [facility] Patient Information 2015 [place], [place]. This information is not intended to replace advice given to you by your health care provider. Make sure you discuss any questions you have with your health care provider.

## 2015-02-25 NOTE — ED Notes (Signed)
Let Dr. Jacqualine Code know pt K is 2.9

## 2015-02-25 NOTE — Consult Note (Signed)
Consulted by: Dr. 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 She was diagnosed with PTC in [facility]. She has moved to [place], [place] recently. Upon initial diagnosis of thyroid cancer in 2009, she was experiencing neck growth, discomfort, and tightness. She was referred to ENT and had imaging and biopsy. She then went for right obectomy before the cytology resulted. She does not believe lymph node dissection was done and recalls the cancer was small. She did have radioactive iodine therapy twice. She has never taken levothyroxine or other thyroid hormone replacement. She followed with ENT for another year but has not seen an Endocrinologist before. She has had her thyroid levels monitored by her OB/gyn since then. She has not had neck imaging since 2009 or 2010.    She presented to the ED today because of neck pain that has progressively worsened over the course of days. Initially, she had neck swelling beginning about a month ago which has progressed. She has had tightness and discomfort which progressed to soreness and now pain in the neck. She had difficulty turning her head and lying down. She has had supine compression, voice hoarseness, and dysphagia. She has lost 10 lbs in the past 2.5 weeks. She has also had severe fatigue and cold intolerance. Her extremities feel tight though she has not notice swelling per se.   ROS: As noted in HPI. Otherwise the 10 pt ROS was negative.  Past Medical History  Diagnosis Date  . Thyroid disease   . Pelvic pain in female   . Ovarian cyst, right   . Irregular periods   . Thyroid cancer    Past Surgical History  Procedure Laterality Date  . Tonsillectomy    . Tubes in ears.    . Thyroid surgery     No current facility-administered  medications on file prior to encounter.   Current Outpatient Prescriptions on File Prior to Encounter  Medication Sig Dispense Refill  . acetaminophen (TYLENOL) 500 MG tablet Take 1,000 mg by mouth every 6 (six) hours as needed for mild pain or moderate pain.    Marland Kitchen ibuprofen (ADVIL,MOTRIN) 200 MG tablet Take 600-800 mg by mouth every 6 (six) hours as needed for moderate pain.    Marland Kitchen oxyCODONE-acetaminophen (ROXICET) 5-325 MG per tablet Take 1 tablet by mouth 3 times/day as needed-between meals & bedtime for severe pain. 30 tablet 0   Allergies  Allergen Reactions  . Diclofenac Rash   Family history: adopted but recently learned her biological mother has hyperthyroidism Social history:   Objective:   Filed Vitals:   02/25/15 1229  BP: 123/90  Pulse: 74  Temp: 98 F (36.7 C)  Resp: 18   GEN: well-appearing young woman, sitting in bed in NAD HEENT: eyes anicteric, EOMI, PERRL, MMM Neck: left neck enlargement visible upon entering the room, some overlying erythema, lymphadenopathy palpable bilaterally, especially in the right anterior cervical region, tenderness to palpation throughout the neck CVS RRR, s1, s2 PULM CTAB ABD soft, nontender to palpation EXTR wwp, no edema SKIN warm, dry NEURO AAOx3, fine tremor of outstretched hands, 2+ patellar reflexes bilaterally with normal relaxation phase  PSYCH normal affect, good insight into her medical condition  Labs:  Component     Latest Ref Rng 02/25/2015  Sodium     135 - 145 mmol/L 139  Potassium     3.5 - 5.1 mmol/L 2.9 (LL)  Chloride     101 - 111 mmol/L 103  CO2     22 - 32 mmol/L 28  Glucose     65 - 99 mg/dL 103 (H)  BUN     6 - 20 mg/dL 19  Creatinine     0.44 - 1.00 mg/dL 0.86  Calcium     8.9 - 10.3 mg/dL 9.5  EGFR (Non-African Amer.)     >60 mL/min >60  EGFR (African American)     >60 mL/min >60  Anion gap     5 - 15 8   Component     Latest Ref Rng 02/25/2015  TSH     0.350 - 4.500 uIU/mL 12.458 (H)   Free T4     0.61 - 1.12 ng/dL 0.62   CBC Latest Ref Rng 02/25/2015 12/07/2014 11/01/2014  WBC 3.6 - 11.0 K/uL 5.0 5.5 6.6  Hemoglobin 12.0 - 16.0 g/dL 13.3 13.1 12.6  Hematocrit 35.0 - 47.0 % 39.5 37.5 36.4  Platelets 150 - 440 K/uL 191 214 227    Imaging:  CLINICAL DATA: 29 year old female with palpable abnormality left side of the neck discovered 2 weeks ago, increasing associated pain. Painful swallowing. Initial encounter. Personal history of thyroid cancer treated in 2009.  EXAM: CT NECK WITH CONTRAST  TECHNIQUE: Multidetector CT imaging of the neck was performed using the standard protocol following the bolus administration of intravenous contrast.  CONTRAST: 58m OMNIPAQUE IOHEXOL 300 MG/ML SOLN  COMPARISON: None.  FINDINGS: Pharynx and larynx: Laryngeal and pharyngeal soft tissue contours are within normal limits. Parapharyngeal and retropharyngeal spaces are normal.  Salivary glands: Sublingual space, submandibular glands, and parotid glands are within normal limits.  Thyroid:  The palpable area of concern is marked at the level of the left thyroid lobe.  The right thyroid lobe appears absent. The left thyroid lobe is heterogeneously enhancing and mildly to moderately enlarged measuring 23 x 30 x 64 mm (AP by transverse by CC) . No discrete or dominant left thyroid nodule. There is a small but conspicuous left level 4/prevascular space lymph node measuring 8 mm short axis at the left thoracic inlet abutting the inferior lobe of the thyroid. See also lymph node findings below.  Lymph nodes: Right side cervical lymph nodes are normal throughout. Small but asymmetric left level IIIa node measuring 6 mm on series 2, image 53. Left level IIa nodes and level 1B nodes appear less asymmetric, up to 6 mm.  Vascular: Major vascular structures in the neck and at the skullbase are patent.  Limited intracranial: Negative.  Visualized orbits:  Negative.  Mastoids and visualized paranasal sinuses: Minor opacification of inferior left mastoid air cells. Other Visualized paranasal sinuses and mastoids are clear.  Skeleton: Multifocal dental caries on the right. Otherwise no acute or suspicious osseous lesion in the neck or upper chest.  Upper chest: Minimal subpleural scarring. No upper lung nodule. Aside from that at the left thoracic inlet, no superior mediastinal lymph node enlargement.  IMPRESSION: 1. Palpable abnormality corresponds to the left thyroid lobe which appears enlarged and heterogeneous without a dominant nodule or lesion. The right lobe appears surgically absent in this patient who gives a history of thyroid cancer. 2. Subcentimeter but conspicuous left level 4 and level 3 lymph nodes are indeterminate in this setting. 3. Study discussed by telephone with Dr. MDelman Kittenon  02/25/2015 at 11:58 . We discussed prompt follow-up with endocrinology plus review of the patient records from [place] to clarify her thyroid history, and hopefully to include prior imaging studies which could be compared with this exam.   Electronically Signed  By: Genevie Ann M.D.  On: 02/25/2015 12:00  Assessment:  29 yo woman with PMH papillary thyroid cancer presenting with neck mass and progressive neck pain, found to have an enlarged left thyroid lobe and some lymphadenopathy on CT neck. Biochemistries significant for a TSH of 12. Given her history of thyroid cancer, she will need prompt outpatient evaluation to rule out cancer recurrence. Will need to initiate therapy for her hypothyroidism as well.       Recommendations: Start levothyroxine 100 mcg daily. Counseled her about appropriate way to take the medication. NSAIDs every 6 hours as needed along with cool compresses to the neck. Follow up with me next week 03/02/15 for neck ultrasound and lymph node mapping. Provided patient with Fax number to have records related  to her thyroid cancer diagnosis and treatment sent to me asap.  Thank you for this consult.  Atha Starks, MD [facility]

## 2015-02-25 NOTE — ED Notes (Signed)
Pt c/o left sided neck swelling with pain over the past 2 weeks to the point now that it hurts to turn her neck or swallow..states she had her thyroid removed in 2009 for thyroid CA.Marland Kitchen

## 2015-02-26 LAB — POCT PREGNANCY, URINE: PREG TEST UR: NEGATIVE

## 2015-03-02 ENCOUNTER — Telehealth: Payer: Self-pay | Admitting: Family Medicine

## 2015-03-02 NOTE — Telephone Encounter (Signed)
Pt is reqeusting a return call at [phone]

## 2015-03-02 NOTE — Telephone Encounter (Signed)
No

## 2015-03-02 NOTE — Telephone Encounter (Signed)
Patient spoke with the Endocrinologist pertaining to her throat and he would like to know if you would be willing to put the patient on hydrocodone. Please return patient call to discuss this

## 2015-03-03 ENCOUNTER — Telehealth: Payer: Self-pay | Admitting: Obstetrics and Gynecology

## 2015-03-03 NOTE — Telephone Encounter (Signed)
Pt would like for you to use this # when you call her back. [phone]

## 2015-03-03 NOTE — Telephone Encounter (Signed)
Patient called wanting to discuss meds. You can reach her at [phone].Thanks

## 2015-03-05 ENCOUNTER — Encounter: Payer: Self-pay | Admitting: Emergency Medicine

## 2015-03-05 ENCOUNTER — Emergency Department
Admission: EM | Admit: 2015-03-05 | Discharge: 2015-03-05 | Disposition: A | Discharge status: Home / Self Care | Payer: Self-pay | Attending: Emergency Medicine | Admitting: Emergency Medicine

## 2015-03-05 DIAGNOSIS — R000 Tachycardia, unspecified: Secondary | ICD-10-CM | POA: Insufficient documentation

## 2015-03-05 DIAGNOSIS — L259 Unspecified contact dermatitis, unspecified cause: Secondary | ICD-10-CM | POA: Insufficient documentation

## 2015-03-05 DIAGNOSIS — M542 Cervicalgia: Secondary | ICD-10-CM | POA: Insufficient documentation

## 2015-03-05 DIAGNOSIS — Z79899 Other long term (current) drug therapy: Secondary | ICD-10-CM | POA: Insufficient documentation

## 2015-03-05 DIAGNOSIS — R079 Chest pain, unspecified: Secondary | ICD-10-CM | POA: Insufficient documentation

## 2015-03-05 DIAGNOSIS — Z87891 Personal history of nicotine dependence: Secondary | ICD-10-CM | POA: Insufficient documentation

## 2015-03-05 LAB — CBC WITH DIFFERENTIAL/PLATELET
Basophils Absolute: 0.1 10*3/uL (ref 0–0.1)
Basophils Relative: 1 %
EOS ABS: 0.3 10*3/uL (ref 0–0.7)
Eosinophils Relative: 5 %
HEMATOCRIT: 38.4 % (ref 35.0–47.0)
Hemoglobin: 13.1 g/dL (ref 12.0–16.0)
LYMPHS PCT: 29 %
Lymphs Abs: 1.7 10*3/uL (ref 1.0–3.6)
MCH: 29.7 pg (ref 26.0–34.0)
MCHC: 34.1 g/dL (ref 32.0–36.0)
MCV: 86.9 fL (ref 80.0–100.0)
Monocytes Absolute: 0.3 10*3/uL (ref 0.2–0.9)
Monocytes Relative: 5 %
NEUTROS ABS: 3.5 10*3/uL (ref 1.4–6.5)
NEUTROS PCT: 60 %
Platelets: 223 10*3/uL (ref 150–440)
RBC: 4.41 MIL/uL (ref 3.80–5.20)
RDW: 13.3 % (ref 11.5–14.5)
WBC: 5.9 10*3/uL (ref 3.6–11.0)

## 2015-03-05 LAB — COMPREHENSIVE METABOLIC PANEL
ALT: 21 U/L (ref 14–54)
AST: 24 U/L (ref 15–41)
Albumin: 5 g/dL (ref 3.5–5.0)
Alkaline Phosphatase: 52 U/L (ref 38–126)
Anion gap: 8 (ref 5–15)
BUN: 12 mg/dL (ref 6–20)
CHLORIDE: 102 mmol/L (ref 101–111)
CO2: 28 mmol/L (ref 22–32)
CREATININE: 0.82 mg/dL (ref 0.44–1.00)
Calcium: 9.3 mg/dL (ref 8.9–10.3)
Glucose, Bld: 106 mg/dL — ABNORMAL HIGH (ref 65–99)
POTASSIUM: 3.7 mmol/L (ref 3.5–5.1)
Sodium: 138 mmol/L (ref 135–145)
Total Bilirubin: 1.3 mg/dL — ABNORMAL HIGH (ref 0.3–1.2)
Total Protein: 8.2 g/dL — ABNORMAL HIGH (ref 6.5–8.1)

## 2015-03-05 LAB — TSH: TSH: 9.083 u[IU]/mL — AB (ref 0.350–4.500)

## 2015-03-05 LAB — T4, FREE: FREE T4: 0.79 ng/dL (ref 0.61–1.12)

## 2015-03-05 MED ORDER — DIPHENHYDRAMINE HCL 50 MG/ML IJ SOLN
25.0000 mg | Freq: Once | INTRAMUSCULAR | Status: AC
  Administered 2015-03-05: 25 mg via INTRAVENOUS
  Filled 2015-03-05: qty 1

## 2015-03-05 MED ORDER — SODIUM CHLORIDE 0.9 % IV SOLN
1000.0000 mL | Freq: Once | INTRAVENOUS | Status: AC
  Administered 2015-03-05: 1000 mL via INTRAVENOUS

## 2015-03-05 MED ORDER — ONDANSETRON HCL 4 MG/2ML IJ SOLN
4.0000 mg | Freq: Once | INTRAMUSCULAR | Status: AC
  Administered 2015-03-05: 4 mg via INTRAVENOUS
  Filled 2015-03-05: qty 2

## 2015-03-05 MED ORDER — HYDROCODONE-ACETAMINOPHEN 5-325 MG PO TABS: 1.0000 | ORAL_TABLET | Freq: Four times a day (QID) | ORAL | Status: DC | PRN

## 2015-03-05 MED ORDER — HYDROCODONE-ACETAMINOPHEN 5-325 MG PO TABS
1.0000 | ORAL_TABLET | Freq: Once | ORAL | Status: AC
  Administered 2015-03-05: 1 via ORAL
  Filled 2015-03-05: qty 1

## 2015-03-05 MED ORDER — HYDROMORPHONE HCL 1 MG/ML IJ SOLN
1.0000 mg | Freq: Once | INTRAMUSCULAR | Status: AC
  Administered 2015-03-05: 1 mg via INTRAVENOUS
  Filled 2015-03-05: qty 1

## 2015-03-05 MED ORDER — MORPHINE SULFATE (PF) 4 MG/ML IV SOLN
4.0000 mg | Freq: Once | INTRAVENOUS | Status: AC
  Administered 2015-03-05: 4 mg via INTRAVENOUS
  Filled 2015-03-05: qty 1

## 2015-03-05 NOTE — ED Provider Notes (Signed)
[facility] Emergency Department Provider Note   ____________________________________________  Time seen: 6:45 PM I have reviewed the triage vital signs and the triage nursing note.  HISTORY  Chief Complaint Chest Pain and Neck Pain   Historian Patient  HPI Anne Hamilton is a 29 y.o. female who is a history of papillary right lobe thyroid cancer which was removed in 2009 for the patient in [place], who has recently been evaluated in the emergency department for neck stiffness and palpable left neck nodule which was found to be an enlarged thyroid for which she was referred to endocrinology and recently had a biopsy 3 days ago on 03/02/15. The patient's TSH was elevated 1 week ago and patient was started on levothyroxine.  Patient reports getting pain control on hydrocodone/assessment medicine when she left the emergency department, however physician doing the biopsy was not going to refill narcotic pain medication. Patient had a biopsy done under local anesthetic, and small area was cleaned. Yesterday she noticed an itchy and red rash over her anterior neck and left-sided neck up into the left jaw near the left ear. No known allergic contact.  She is not having trouble breathing, or trouble swallowing. No altered mental status. No lower extremity swelling.    Past Medical History  Diagnosis Date  . Thyroid disease   . Pelvic pain in female   . Ovarian cyst, right   . Irregular periods   . Thyroid cancer     Patient Active Problem List   Diagnosis Date Noted  . Thyroid goiter 02/18/2015  . Anterior neck pain 02/18/2015  . Irregular menses 02/18/2015    Past Surgical History  Procedure Laterality Date  . Tonsillectomy    . Tubes in ears.    . Thyroid surgery      Current Outpatient Rx  Name  Route  Sig  Dispense  Refill  . acetaminophen (RA ACETAMINOPHEN) 650 MG CR tablet   Oral   Take 650 mg by mouth every 8 (eight) hours as needed. For  pain.         Marland Kitchen ibuprofen (ADVIL,MOTRIN) 200 MG tablet   Oral   Take 800-1,000 mg by mouth every 6 (six) hours as needed for mild pain or moderate pain.          Marland Kitchen levothyroxine (SYNTHROID) 100 MCG tablet   Oral   Take 1 tablet (100 mcg total) by mouth daily.   30 tablet   0   . traMADol (ULTRAM) 50 MG tablet   Oral   Take 50 mg by mouth every 6 (six) hours as needed. For pain.         Marland Kitchen HYDROcodone-acetaminophen (NORCO/VICODIN) 5-325 MG per tablet   Oral   Take 1 tablet by mouth every 6 (six) hours as needed for moderate pain.   8 tablet   0     Allergies Diclofenac and Oxycodone-acetaminophen  Family History  Problem Relation Age of Onset  . Adopted: Yes    Social History Social History  Substance Use Topics  . Smoking status: Former Research scientist (life sciences)  . Smokeless tobacco: Never Used     Comment: patient states she quit 41yrs ago.  . Alcohol Use: No     Comment: rarely    Review of Systems  Constitutional: Negative for fever. Eyes: Negative for visual changes. ENT: Negative for sore throat. Cardiovascular: Negative for chest pain. Respiratory: Negative for shortness of breath. Gastrointestinal: Negative for abdominal pain, vomiting and diarrhea. Genitourinary: Negative for  dysuria. Musculoskeletal: Negative for back pain. Skin: skin rash in the neck as per history of present illness Neurological: Negative for headache. 10 point Review of Systems otherwise negative ____________________________________________   PHYSICAL EXAM:  VITAL SIGNS: ED Triage Vitals  Enc Vitals Group     BP 03/05/15 1631 116/81 mmHg     Pulse Rate 03/05/15 1631 108     Resp 03/05/15 1631 18     Temp 03/05/15 1631 98 F (36.7 C)     Temp Source 03/05/15 1631 Oral     SpO2 03/05/15 1631 100 %     Weight 03/05/15 1631 135 lb (61.236 kg)     Height 03/05/15 1631 5\' 6"  (1.676 m)     Head Cir --      Peak Flow --      Pain Score 03/05/15 1843 4     Pain Loc --      Pain Edu? --       Excl. in [place]? --      Constitutional: Alert and oriented. Well appearing and in no distress. Eyes: Conjunctivae are normal. PERRL. Normal extraocular movements. ENT   Head: Normocephalic and atraumatic.   Nose: No congestion/rhinnorhea.   Mouth/Throat: Mucous membranes are moist.   Neck: No stridor. Erythema across the anterior neck in the distribution of the thyroid gland itself with some mild edema to the skin and small hives on the left side extending up onto the left jaw line. Cardiovascular/Chest: tachycardic and regular.No murmurs, rubs, or gallops. Respiratory: Normal respiratory effort without tachypnea nor retractions. Breath sounds are clear and equal bilaterally. No wheezes/rales/rhonchi. Gastrointestinal: Soft. No distention, no guarding, no rebound. Nontender   Genitourinary/rectal:Deferred Musculoskeletal: Nontender with normal range of motion in all extremities. No joint effusions.  No lower extremity tenderness.  No edema. Neurologic:  Normal speech and language. No gross or focal neurologic deficits are appreciated. Skin:  Skin is warm, dry.skin rash to neck as above Psychiatric: Mood and affect are normal. Speech and behavior are normal. Patient exhibits appropriate insight and judgment.  ____________________________________________   EKG I, Lisa Roca, MD, the attending physician have personally viewed and interpreted all ECGs.  90 bpm. Normal sinus rhythm. Narrow QRS. Normal axis. T-wave inversions inferiorly and laterally. Minor ST depression inferiorly. ____________________________________________  LABS (pertinent positives/negatives)  comprehensive metabolic panel without significant abnormality CBC within normal limits TSH 9.083 T4 free, 0.79  ____________________________________________  RADIOLOGY All Xrays were viewed by me. Imaging interpreted by  Radiologist.  none __________________________________________  PROCEDURES  Procedure(s) performed: None  Critical Care performed: None  ____________________________________________   ED COURSE / ASSESSMENT AND PLAN  CONSULTATIONS: None  Pertinent labs & imaging results that were available during my care of the patient were reviewed by me and considered in my medical decision making (see chart for details).   The patient is here because she is having persistent anterior neck pain which is associated with a recently diagnosed thyromegaly, and post biopsy from 3 days ago. She states the pain is not necessarily any worse, however now she doesn't have Norco, it does seem uncontrolled. She's not having any trouble breathing or swallowing, and I do not suspect hematoma clinically, or infection clinically. I will treat her with Norco over the weekend until she can see her primary care physician this week.  Patient is also here for skin rash over the anterior neck and left jaw line which appears to be an allergic/contact irritant dermatitis. Benadryl did significantly decrease the redness and itching.  Patient will take this over-the-counter. We discussed not using prednisone at this point in time. She will try over-the-counter Hydrocortisoneas well as the Benadryl.  Patient / Family / Caregiver informed of clinical course, medical decision-making process, and agree with plan.   I discussed return precautions, follow-up instructions, and discharged instructions with patient and/or family.  ___________________________________________   FINAL CLINICAL IMPRESSION(S) / ED DIAGNOSES   Final diagnoses:  Contact dermatitis  Anterior neck pain       Lisa Roca, MD 03/05/15 2131

## 2015-03-05 NOTE — Discharge Instructions (Signed)
You were evaluated for persistent anterior neck pain related to enlarge thyroid after thyroid biopsy,and were prescribed several days of Norco for pain control. You may also take ibuprofen 600 mg every 8 hours for moderate pain.  You were evaluated for a skin rash over the neck and left face, which appears to be allergic or contact dermatitis. Continue to take over-the-counter Benadryl 25 mg every 4 hours as needed for itching and redness.you may also try over-the-counter hydrocortisone cream to the skin rash/itching area.  Return to the emergency department for any new or worsening condition including any fever, any trouble swallowing, any trouble breathing, any wheezing, heart racing, dizziness or passing out, worsening skin rash, or any other symptoms concerning to you.  Follow up with your primary care doctor as soon as possible, Monday or Tuesday.   Contact Dermatitis Contact dermatitis is a reaction to certain substances that touch the skin. Contact dermatitis can be either irritant contact dermatitis or allergic contact dermatitis. Irritant contact dermatitis does not require previous exposure to the substance for a reaction to occur.Allergic contact dermatitis only occurs if you have been exposed to the substance before. Upon a repeat exposure, your body reacts to the substance.  CAUSES  Many substances can cause contact dermatitis. Irritant dermatitis is most commonly caused by repeated exposure to mildly irritating substances, such as:  Makeup.  Soaps.  Detergents.  Bleaches.  Acids.  Metal salts, such as nickel. Allergic contact dermatitis is most commonly caused by exposure to:  Poisonous plants.  Chemicals (deodorants, shampoos).  Jewelry.  Latex.  Neomycin in triple antibiotic cream.  Preservatives in products, including clothing. SYMPTOMS  The area of skin that is exposed may develop:  Dryness or flaking.  Redness.  Cracks.  Itching.  Pain or a burning  sensation.  Blisters. With allergic contact dermatitis, there may also be swelling in areas such as the eyelids, mouth, or genitals.  DIAGNOSIS  Your caregiver can usually tell what the problem is by doing a physical exam. In cases where the cause is uncertain and an allergic contact dermatitis is suspected, a patch skin test may be performed to help determine the cause of your dermatitis. TREATMENT Treatment includes protecting the skin from further contact with the irritating substance by avoiding that substance if possible. Barrier creams, powders, and gloves may be helpful. Your caregiver may also recommend:  Steroid creams or ointments applied 2 times daily. For best results, soak the rash area in cool water for 20 minutes. Then apply the medicine. Cover the area with a plastic wrap. You can store the steroid cream in the refrigerator for a "chilly" effect on your rash. That may decrease itching. Oral steroid medicines may be needed in more severe cases.  Antibiotics or antibacterial ointments if a skin infection is present.  Antihistamine lotion or an antihistamine taken by mouth to ease itching.  Lubricants to keep moisture in your skin.  Burow's solution to reduce redness and soreness or to dry a weeping rash. Mix one packet or tablet of solution in 2 cups cool water. Dip a clean washcloth in the mixture, wring it out a bit, and put it on the affected area. Leave the cloth in place for 30 minutes. Do this as often as possible throughout the day.  Taking several cornstarch or baking soda baths daily if the area is too large to cover with a washcloth. Harsh chemicals, such as alkalis or acids, can cause skin damage that is like a burn. You should  flush your skin for 15 to 20 minutes with cold water after such an exposure. You should also seek immediate medical care after exposure. Bandages (dressings), antibiotics, and pain medicine may be needed for severely irritated skin.  HOME CARE  INSTRUCTIONS  Avoid the substance that caused your reaction.  Keep the area of skin that is affected away from hot water, soap, sunlight, chemicals, acidic substances, or anything else that would irritate your skin.  Do not scratch the rash. Scratching may cause the rash to become infected.  You may take cool baths to help stop the itching.  Only take over-the-counter or prescription medicines as directed by your caregiver.  See your caregiver for follow-up care as directed to make sure your skin is healing properly. SEEK MEDICAL CARE IF:   Your condition is not better after 3 days of treatment.  You seem to be getting worse.  You see signs of infection such as swelling, tenderness, redness, soreness, or warmth in the affected area.  You have any problems related to your medicines. Document Released: 06/08/2000 Document Revised: 09/03/2011 Document Reviewed: 11/14/2010 [facility] Patient Information 2015 [place], [place]. This information is not intended to replace advice given to you by your health care provider. Make sure you discuss any questions you have with your health care provider.

## 2015-03-05 NOTE — ED Notes (Addendum)
Reports having thyroid biopsy on the 7th, states today started having pain in neck and left side of face and chest.  Rash noted to left side of face.

## 2015-03-07 ENCOUNTER — Telehealth: Payer: Self-pay

## 2015-03-07 LAB — T3, FREE: T3 FREE: 3 pg/mL (ref 2.0–4.4)

## 2015-03-07 NOTE — Telephone Encounter (Signed)
This is about the 4th time she has requested pain medication for her thyroid issues. This is inappropriate as I am not managing her thyroid issues. She sees an Musician who is responsible for further management of her thyroid disorder including post biopsy pain. Also Ms Belynda never got the lab work that I ordered done. As far as I am concerned there is nothing that I am managing under the scope of my practice which requires me to manage her pain.

## 2015-03-07 NOTE — Telephone Encounter (Signed)
Got a message from Willadean Carol, RN with [facility], stating that this patient called on 03/05/15 @ 1:08:46PM & 03/05/15 at 1:21:36pm, stating that she has a rash on her neck where she has had her biopsy done.   I then called the patient to see what was going on and she stated that she had an allergic reaction to the topical spray that the put on her to do the biopsy, but the real reason she called was to tell us that her sx got worst after seeing the endocrinologist so she went to the ER and she said that they both stated they had no problem with Dr. Nadine Counts putting her pain medication if she was willing to do it.   I told her that I would forward this message to Dr. Nadine Counts and someone will give her a call back.

## 2015-03-08 ENCOUNTER — Emergency Department
Admission: EM | Admit: 2015-03-08 | Discharge: 2015-03-08 | Disposition: A | Discharge status: Home / Self Care | Payer: Self-pay | Attending: Emergency Medicine | Admitting: Emergency Medicine

## 2015-03-08 ENCOUNTER — Encounter: Payer: Self-pay | Admitting: Emergency Medicine

## 2015-03-08 ENCOUNTER — Emergency Department: Payer: Self-pay

## 2015-03-08 DIAGNOSIS — Z87891 Personal history of nicotine dependence: Secondary | ICD-10-CM | POA: Insufficient documentation

## 2015-03-08 DIAGNOSIS — M549 Dorsalgia, unspecified: Secondary | ICD-10-CM | POA: Insufficient documentation

## 2015-03-08 DIAGNOSIS — R0602 Shortness of breath: Secondary | ICD-10-CM | POA: Insufficient documentation

## 2015-03-08 DIAGNOSIS — R079 Chest pain, unspecified: Secondary | ICD-10-CM | POA: Insufficient documentation

## 2015-03-08 DIAGNOSIS — Z79899 Other long term (current) drug therapy: Secondary | ICD-10-CM | POA: Insufficient documentation

## 2015-03-08 DIAGNOSIS — R42 Dizziness and giddiness: Secondary | ICD-10-CM | POA: Insufficient documentation

## 2015-03-08 DIAGNOSIS — R221 Localized swelling, mass and lump, neck: Secondary | ICD-10-CM | POA: Insufficient documentation

## 2015-03-08 DIAGNOSIS — Z7952 Long term (current) use of systemic steroids: Secondary | ICD-10-CM | POA: Insufficient documentation

## 2015-03-08 LAB — BASIC METABOLIC PANEL
Anion gap: 8 (ref 5–15)
BUN: 9 mg/dL (ref 6–20)
CHLORIDE: 103 mmol/L (ref 101–111)
CO2: 27 mmol/L (ref 22–32)
Calcium: 9.3 mg/dL (ref 8.9–10.3)
Creatinine, Ser: 0.74 mg/dL (ref 0.44–1.00)
GFR calc Af Amer: >60 mL/min (ref >60)
GFR calc non Af Amer: >60 mL/min (ref >60)
Glucose, Bld: 103 mg/dL — ABNORMAL HIGH (ref 65–99)
POTASSIUM: 3.6 mmol/L (ref 3.5–5.1)
SODIUM: 138 mmol/L (ref 135–145)

## 2015-03-08 LAB — CBC
HEMATOCRIT: 37.8 % (ref 35.0–47.0)
HEMOGLOBIN: 13 g/dL (ref 12.0–16.0)
MCH: 30.2 pg (ref 26.0–34.0)
MCHC: 34.3 g/dL (ref 32.0–36.0)
MCV: 87.9 fL (ref 80.0–100.0)
Platelets: 215 10*3/uL (ref 150–440)
RBC: 4.3 MIL/uL (ref 3.80–5.20)
RDW: 13.2 % (ref 11.5–14.5)
WBC: 5.3 10*3/uL (ref 3.6–11.0)

## 2015-03-08 LAB — TROPONIN I: Troponin I: <0.03 ng/mL (ref <0.031)

## 2015-03-08 NOTE — Discharge Instructions (Signed)
Please seek medical attention for any high fevers, chest pain, shortness of breath, change in behavior, persistent vomiting, bloody stool or any other new or concerning symptoms. ° °Chest Pain (Nonspecific) °It is often hard to give a specific diagnosis for the cause of chest pain. There is always a chance that your pain could be related to something serious, such as a heart attack or a blood clot in the lungs. You need to follow up with your health care provider for further evaluation. °CAUSES  °· Heartburn. °· Pneumonia or bronchitis. °· Anxiety or stress. °· Inflammation around your heart (pericarditis) or lung (pleuritis or pleurisy). °· A blood clot in the lung. °· A collapsed lung (pneumothorax). It can develop suddenly on its own (spontaneous pneumothorax) or from trauma to the chest. °· Shingles infection (herpes zoster virus). °The chest wall is composed of bones, muscles, and cartilage. Any of these can be the source of the pain. °· The bones can be bruised by injury. °· The muscles or cartilage can be strained by coughing or overwork. °· The cartilage can be affected by inflammation and become sore (costochondritis). °DIAGNOSIS  °Lab tests or other studies may be needed to find the cause of your pain. Your health care provider may have you take a test called an ambulatory electrocardiogram (ECG). An ECG records your heartbeat patterns over a 24-hour period. You may also have other tests, such as: °· Transthoracic echocardiogram (TTE). During echocardiography, sound waves are used to evaluate how blood flows through your heart. °· Transesophageal echocardiogram (TEE). °· Cardiac monitoring. This allows your health care provider to monitor your heart rate and rhythm in real time. °· Holter monitor. This is a portable device that records your heartbeat and can help diagnose heart arrhythmias. It allows your health care provider to track your heart activity for several days, if needed. °· Stress tests by  exercise or by giving medicine that makes the heart beat faster. °TREATMENT  °· Treatment depends on what may be causing your chest pain. Treatment may include: °¨ Acid blockers for heartburn. °¨ Anti-inflammatory medicine. °¨ Pain medicine for inflammatory conditions. °¨ Antibiotics if an infection is present. °· You may be advised to change lifestyle habits. This includes stopping smoking and avoiding alcohol, caffeine, and chocolate. °· You may be advised to keep your head raised (elevated) when sleeping. This reduces the chance of acid going backward from your stomach into your esophagus. °Most of the time, nonspecific chest pain will improve within 2-3 days with rest and mild pain medicine.  °HOME CARE INSTRUCTIONS  °· If antibiotics were prescribed, take them as directed. Finish them even if you start to feel better. °· For the next few days, avoid physical activities that bring on chest pain. Continue physical activities as directed. °· Do not use any tobacco products, including cigarettes, chewing tobacco, or electronic cigarettes. °· Avoid drinking alcohol. °· Only take medicine as directed by your health care provider. °· Follow your health care provider's suggestions for further testing if your chest pain does not go away. °· Keep any follow-up appointments you made. If you do not go to an appointment, you could develop lasting (chronic) problems with pain. If there is any problem keeping an appointment, call to reschedule. °SEEK MEDICAL CARE IF:  °· Your chest pain does not go away, even after treatment. °· You have a rash with blisters on your chest. °· You have a fever. °SEEK IMMEDIATE MEDICAL CARE IF:  °· You have increased chest   pain or pain that spreads to your arm, neck, jaw, back, or abdomen. °· You have shortness of breath. °· You have an increasing cough, or you cough up blood. °· You have severe back or abdominal pain. °· You feel nauseous or vomit. °· You have severe weakness. °· You  faint. °· You have chills. °This is an emergency. Do not wait to see if the pain will go away. Get medical help at once. Call your local emergency services (911 in [place] Do not drive yourself to the hospital. °MAKE SURE YOU:  °· Understand these instructions. °· Will watch your condition. °· Will get help right away if you are not doing well or get worse. °Document Released: 03/21/2005 Document Revised: 06/16/2013 Document Reviewed: 01/15/2008 °[employer]® Patient Information ©2015 [employer]. This information is not intended to replace advice given to you by your health care provider. Make sure you discuss any questions you have with your health care provider. ° °

## 2015-03-08 NOTE — Telephone Encounter (Signed)
See my messages below and in other telephone calls for my answer.

## 2015-03-08 NOTE — ED Notes (Signed)
Pt to ed with c/o chest pain that started about 3 pm today.  Pt states she was sitting when the pain started.  Pt also reports sob, dizziness and radiation of pain to left shoulder.  Pt reports recent diagnosis of throat cancer and reports increased swelling to neck area. Pt was also seen here three days ago for chest pain.

## 2015-03-08 NOTE — Telephone Encounter (Signed)
Anne Hamilton spoke with Anne Hamilton on 03/08/15 @ 4:45pm and informed Anne Hamilton that Dr. Nadine Counts will not prescribe any pain medication in regards to Anne Hamilton thyroid issue at all.  I informed Anne Hamilton that she needs to seek alternative advice from the Endocrinologist that is managing Anne Hamilton thyroid issues and to let the specialist know that Dr. Nadine Counts will not prescribe the Anne Hamilton pain meds.

## 2015-03-08 NOTE — ED Provider Notes (Signed)
[facility] Emergency Department Provider Note    ____________________________________________  Time seen: 2245  I have reviewed the triage vital signs and the nursing notes.   HISTORY  Chief Complaint Chest Pain   History limited by: Not Limited   HPI Anne Hamilton is a 29 y.o. female who presents to the emergency department. Medical complaints however married complaint today for the triage nurse with chest pain however when I talked her main complaint was from early next swelling and dizziness. Patient is also complaining of shortness of breath and back pain.The patient has had issues with these symptoms for the past couple of days. 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 She is followed by endocrinology. She was recently placed on a prednisone taper and had her first dose today. She denies any fevers.     Past Medical History  Diagnosis Date  . Thyroid disease   . Pelvic pain in female   . Ovarian cyst, right   . Irregular periods   . Thyroid cancer     Patient Active Problem List   Diagnosis Date Noted  . Thyroid goiter 02/18/2015  . Anterior neck pain 02/18/2015  . Irregular menses 02/18/2015    Past Surgical History  Procedure Laterality Date  . Tonsillectomy    . Tubes in ears.    . Thyroid surgery      Current Outpatient Rx  Name  Route  Sig  Dispense  Refill  . levothyroxine (SYNTHROID) 100 MCG tablet   Oral   Take 1 tablet (100 mcg total) by mouth daily.   30 tablet   0   . predniSONE (DELTASONE) 20 MG tablet   Oral   Take 20 mg by mouth daily with breakfast.         . acetaminophen (RA ACETAMINOPHEN) 650 MG CR tablet   Oral   Take 650 mg by mouth every 8 (eight) hours as needed. For pain.         Marland Kitchen HYDROcodone-acetaminophen (NORCO/VICODIN) 5-325 MG per tablet   Oral   Take 1 tablet by mouth every 6 (six) hours as needed for moderate pain.   8 tablet   0    . ibuprofen (ADVIL,MOTRIN) 200 MG tablet   Oral   Take 800-1,000 mg by mouth every 6 (six) hours as needed for mild pain or moderate pain.          . traMADol (ULTRAM) 50 MG tablet   Oral   Take 50 mg by mouth every 6 (six) hours as needed. For pain.           Allergies Diclofenac and Oxycodone-acetaminophen  Family History  Problem Relation Age of Onset  . Adopted: Yes    Social History Social History  Substance Use Topics  . Smoking status: Former Research scientist (life sciences)  . Smokeless tobacco: Never Used     Comment: patient states she quit 66yrs ago.  . Alcohol Use: No     Comment: rarely    Review of Systems  Constitutional: Negative for fever. Cardiovascular: Positive for chest pain. Respiratory: Negative for shortness of breath. Gastrointestinal: Negative for abdominal pain, vomiting and diarrhea. Genitourinary: Negative for dysuria. Musculoskeletal: Negative for back pain. Skin: Negative for rash. Neurological: Negative for headaches, focal weakness or numbness.  10-point ROS otherwise negative.  ____________________________________________   PHYSICAL EXAM:  VITAL SIGNS: ED Triage Vitals  Enc Vitals Group     BP 03/08/15 1810  134/84 mmHg     Pulse Rate 03/08/15 1810 84     Resp 03/08/15 1810 20     Temp 03/08/15 1810 98.3 F (36.8 C)     Temp Source 03/08/15 1810 Oral     SpO2 03/08/15 1810 100 %     Weight 03/08/15 1810 135 lb (61.236 kg)     Height 03/08/15 1810 5\' 6"  (1.676 m)     Head Cir --      Peak Flow --      Pain Score 03/08/15 1811 7   Constitutional: Alert and oriented. Well appearing and in no distress. Eyes: Conjunctivae are normal. PERRL. Normal extraocular movements. ENT   Head: Normocephalic and atraumatic.   Nose: No congestion/rhinnorhea.   Mouth/Throat: Mucous membranes are moist.   Neck: No stridor. Mild swelling and rash to the anterior neck. Hematological/Lymphatic/Immunilogical: No cervical  lymphadenopathy. Cardiovascular: Normal rate, regular rhythm.  No murmurs, rubs, or gallops. Respiratory: Normal respiratory effort without tachypnea nor retractions. Breath sounds are clear and equal bilaterally. No wheezes/rales/rhonchi. Gastrointestinal: Soft and nontender. No distention.  Genitourinary: Deferred Musculoskeletal: Normal range of motion in all extremities. No joint effusions.  No lower extremity tenderness nor edema. Neurologic:  Normal speech and language. No gross focal neurologic deficits are appreciated. Speech is normal.  Skin:  Skin is warm, dry and intact. No rash noted. Psychiatric: Mood and affect are normal. Speech and behavior are normal. Patient exhibits appropriate insight and judgment.  ____________________________________________    LABS (pertinent positives/negatives)  Labs Reviewed  BASIC METABOLIC PANEL - Abnormal; Notable for the following:    Glucose, Bld 103 (*)    All other components within normal limits  CBC  TROPONIN I     ____________________________________________   EKG  I, Nance Pear, attending physician, personally viewed and interpreted this EKG  EKG Time: 1806 Rate: 78 Rhythm: NSR Axis: normal Intervals: qtc 426 QRS: narrow ST changes: no st elevation Impression: normal ekg  ____________________________________________    RADIOLOGY  CXR  IMPRESSION: Known left thyroid mass. No acute cardiopulmonary process.  ____________________________________________   PROCEDURES  Procedure(s) performed: None  Critical Care performed: No  ____________________________________________   INITIAL IMPRESSION / ASSESSMENT AND PLAN / ED COURSE  Pertinent labs & imaging results that were available during my care of the patient were reviewed by me and considered in my medical decision making (see chart for details).  Patient presented to the emergency department today with multiple medical complaints. Her primary  complaint for coming today was pain. It seems that she has been chest pain. Chest x-ray troponin and blood work without any concerning findings. Physical exam without any concerning physical findings, lungs and heart sounds are normal. Patient does have a small rash and swelling to her anterior neck which she has had for the past couple days. Did advise the patient to continue the prednisone taper and follow-up with her endocrinologist.  ____________________________________________   FINAL CLINICAL IMPRESSION(S) / ED DIAGNOSES  Final diagnoses:  Chest pain, unspecified chest pain type  Throat swelling     Nance Pear, MD 03/08/15 2324

## 2015-03-08 NOTE — Telephone Encounter (Signed)
Patient stated that she was going to have the labs done, but was seen at the ER and they did everything there. She stated that the endocrinologist cannot put her on pain meds and both the endocrinologist and the ER doc stated that she needed to be on something to help cope with the pain. She stated that it will only be temporary due to her upcoming surgery that they are scheduling soon.   I told her that I will consult with Dr. Nadine Counts and will give her a call back. She stated that she has been taking a lot of ibuprofen and tylenol and is only asking for something that will give her some relief even if it is a small amount.

## 2015-03-08 NOTE — Telephone Encounter (Signed)
Patient called back stating that she did not understand why Dr. Nadine Counts could not rpescribe her any pain meds since her endocrinologist could not and stated that she needed it. I then told the patient that I was going to turn the call over to my office manager.

## 2015-03-14 ENCOUNTER — Emergency Department
Admission: EM | Admit: 2015-03-14 | Discharge: 2015-03-14 | Disposition: A | Discharge status: Home / Self Care | Payer: Self-pay | Attending: Emergency Medicine | Admitting: Emergency Medicine

## 2015-03-14 ENCOUNTER — Encounter: Payer: Self-pay | Admitting: Emergency Medicine

## 2015-03-14 DIAGNOSIS — G893 Neoplasm related pain (acute) (chronic): Secondary | ICD-10-CM | POA: Insufficient documentation

## 2015-03-14 DIAGNOSIS — C73 Malignant neoplasm of thyroid gland: Secondary | ICD-10-CM | POA: Insufficient documentation

## 2015-03-14 DIAGNOSIS — M542 Cervicalgia: Secondary | ICD-10-CM | POA: Insufficient documentation

## 2015-03-14 DIAGNOSIS — Z79899 Other long term (current) drug therapy: Secondary | ICD-10-CM | POA: Insufficient documentation

## 2015-03-14 DIAGNOSIS — Z7952 Long term (current) use of systemic steroids: Secondary | ICD-10-CM | POA: Insufficient documentation

## 2015-03-14 DIAGNOSIS — Z87891 Personal history of nicotine dependence: Secondary | ICD-10-CM | POA: Insufficient documentation

## 2015-03-14 LAB — CBC
HCT: 39.8 % (ref 35.0–47.0)
Hemoglobin: 13.4 g/dL (ref 12.0–16.0)
MCH: 29.5 pg (ref 26.0–34.0)
MCHC: 33.7 g/dL (ref 32.0–36.0)
MCV: 87.5 fL (ref 80.0–100.0)
PLATELETS: 216 10*3/uL (ref 150–440)
RBC: 4.55 MIL/uL (ref 3.80–5.20)
RDW: 13.2 % (ref 11.5–14.5)
WBC: 7.4 10*3/uL (ref 3.6–11.0)

## 2015-03-14 LAB — COMPREHENSIVE METABOLIC PANEL
ALT: 16 U/L (ref 14–54)
ANION GAP: 8 (ref 5–15)
AST: 24 U/L (ref 15–41)
Albumin: 5.3 g/dL — ABNORMAL HIGH (ref 3.5–5.0)
Alkaline Phosphatase: 54 U/L (ref 38–126)
BUN: 11 mg/dL (ref 6–20)
CHLORIDE: 103 mmol/L (ref 101–111)
CO2: 27 mmol/L (ref 22–32)
CREATININE: 0.71 mg/dL (ref 0.44–1.00)
Calcium: 9.7 mg/dL (ref 8.9–10.3)
Glucose, Bld: 101 mg/dL — ABNORMAL HIGH (ref 65–99)
Potassium: 3.8 mmol/L (ref 3.5–5.1)
SODIUM: 138 mmol/L (ref 135–145)
Total Bilirubin: 1.5 mg/dL — ABNORMAL HIGH (ref 0.3–1.2)
Total Protein: 8.6 g/dL — ABNORMAL HIGH (ref 6.5–8.1)

## 2015-03-14 MED ORDER — HYDROMORPHONE HCL 1 MG/ML IJ SOLN
1.0000 mg | Freq: Once | INTRAMUSCULAR | Status: AC
  Administered 2015-03-14: 1 mg via INTRAVENOUS
  Filled 2015-03-14: qty 1

## 2015-03-14 MED ORDER — SODIUM CHLORIDE 0.9 % IV SOLN
1000.0000 mL | Freq: Once | INTRAVENOUS | Status: AC
  Administered 2015-03-14: 1000 mL via INTRAVENOUS

## 2015-03-14 MED ORDER — HYDROCODONE-ACETAMINOPHEN 5-325 MG PO TABS: 1.0000 | ORAL_TABLET | ORAL | Status: DC | PRN

## 2015-03-14 NOTE — Telephone Encounter (Signed)
PT IS A DRUG SEEKER AND SHE KNOWS THAT [facility] ISNT GOING TO RX ANY PAIN MEDICATION FOR HER, THIS HAS BEEN DISCUSSED MULTIPLE TIMES WITH PT

## 2015-03-14 NOTE — ED Notes (Signed)
Reports throat pain.  Thyroid CA.  No resp distress, skin w/d

## 2015-03-14 NOTE — ED Provider Notes (Signed)
[name] Emergency Department Provider Note  ____________________________________________  Time seen: On arrival  I have reviewed the triage vital signs and the nursing notes.   HISTORY  Chief Complaint Pain in her neck   HPI Anne Hamilton is a 29 y.o. female with recently diagnosed recurrent thyroid cancer with several ED visits over the past couple of weeks who presents with pain in the anterior neck which is associated with her thyroid mass. She has been following with endocrinology at [facility] and has a follow-up appointment in 2 days. She reports she has been out of pain medication and has been having significant discomfort. She is able to swallow and is tolerating by mouth's. She denies fevers chills. She has occasionally had some dizziness. She reports this is all very similar to the last time [place]. She had a CT scan done earlier this month     Past Medical History  Diagnosis Date  . Thyroid disease   . Pelvic pain in female   . Ovarian cyst, right   . Irregular periods   . Thyroid cancer     Patient Active Problem List   Diagnosis Date Noted  . Thyroid goiter 02/18/2015  . Anterior neck pain 02/18/2015  . Irregular menses 02/18/2015    Past Surgical History  Procedure Laterality Date  . Tonsillectomy    . Tubes in ears.    . Thyroid surgery      Current Outpatient Rx  Name  Route  Sig  Dispense  Refill  . acetaminophen (RA ACETAMINOPHEN) 650 MG CR tablet   Oral   Take 650 mg by mouth every 8 (eight) hours as needed. For pain.         Marland Kitchen HYDROcodone-acetaminophen (NORCO/VICODIN) 5-325 MG per tablet   Oral   Take 1 tablet by mouth every 6 (six) hours as needed for moderate pain.   8 tablet   0   . ibuprofen (ADVIL,MOTRIN) 200 MG tablet   Oral   Take 800-1,000 mg by mouth every 6 (six) hours as needed for mild pain or moderate pain.          Marland Kitchen levothyroxine (SYNTHROID) 100  MCG tablet   Oral   Take 1 tablet (100 mcg total) by mouth daily.   30 tablet   0   . predniSONE (DELTASONE) 20 MG tablet   Oral   Take 20 mg by mouth daily with breakfast.         . traMADol (ULTRAM) 50 MG tablet   Oral   Take 50 mg by mouth every 6 (six) hours as needed. For pain.           Allergies Diclofenac and Oxycodone-acetaminophen  Family History  Problem Relation Age of Onset  . Adopted: Yes    Social History Social History  Substance Use Topics  . Smoking status: Former Research scientist (life sciences)  . Smokeless tobacco: Never Used     Comment: patient states she quit 85yrs ago.  . Alcohol Use: No     Comment: rarely    Review of Systems  Constitutional: Negative for fever. Eyes: Negative for visual changes. ENT: Positive for anterior neck pain Cardiovascular: Negative for chest pain. Respiratory: Negative for shortness of breath. Gastrointestinal: Negative for abdominal pain, vomiting and diarrhea. Genitourinary: Negative for dysuria. Musculoskeletal: Negative for back pain. Skin: Negative for rash. Neurological: Negative for headaches or focal weakness Psychiatric: No anxiety    ____________________________________________  PHYSICAL EXAM:  VITAL SIGNS: ED Triage Vitals  Enc Vitals Group     BP 03/14/15 1050 123/89 mmHg     Pulse Rate 03/14/15 1050 84     Resp 03/14/15 1050 16     Temp 03/14/15 1050 98.3 F (36.8 C)     Temp Source 03/14/15 1050 Oral     SpO2 03/14/15 1050 99 %     Weight 03/14/15 1050 136 lb (61.689 kg)     Height 03/14/15 1050 5\' 6"  (1.676 m)     Head Cir --      Peak Flow --      Pain Score 03/14/15 1052 7     Pain Loc --      Pain Edu? --      Excl. in [place]? --      Constitutional: Alert and oriented. Well appearing and in no distress. Eyes: Conjunctivae are normal.  ENT   Head: Normocephalic and atraumatic.   Mouth/Throat: Mucous membranes are moist. Anterior neck with thyroid mass right greater than left. Pharynx  normal Cardiovascular: Normal rate, regular rhythm. Normal and symmetric distal pulses are present in all extremities. No murmurs, rubs, or gallops. Respiratory: Normal respiratory effort without tachypnea nor retractions. Breath sounds are clear and equal bilaterally.  Gastrointestinal: Soft and non-tender in all quadrants. No distention. There is no CVA tenderness. Genitourinary: deferred Musculoskeletal: Nontender with normal range of motion in all extremities. No lower extremity tenderness nor edema. Neurologic:  Normal speech and language. No gross focal neurologic deficits are appreciated. Skin:  Skin is warm, dry and intact. No rash noted. Psychiatric: Mood and affect are normal. Patient exhibits appropriate insight and judgment.  ____________________________________________    LABS (pertinent positives/negatives)  Labs Reviewed  CBC  COMPREHENSIVE METABOLIC PANEL    ____________________________________________   EKG  None  ____________________________________________    RADIOLOGY I have personally reviewed any xrays that were ordered on this patient: None I reviewed CT from earlier this month ____________________________________________   PROCEDURES  Procedure(s) performed: none  Critical Care performed: none  ____________________________________________   INITIAL IMPRESSION / ASSESSMENT AND PLAN / ED COURSE  Pertinent labs & imaging results that were available during my care of the patient were reviewed by me and considered in my medical decision making (see chart for details).  Patient with discomfort secondary to thyroid mass. Unfortunately I do not have definitive way of treating this currently. Patient has follow up with her endocrinologist in 2 days at which time they're going to develop a plan of treatment. No fever no chills. Normal vitals here. Patient received 1 L of IV fluid and pain medications. She reports she is feeling slightly better. We will  refill her Vicodin. Close follow-up is required and she understands this. Return precautions discussed  ____________________________________________   FINAL CLINICAL IMPRESSION(S) / ED DIAGNOSES  Final diagnoses:  Cancer associated pain     Lavonia Drafts, MD 03/14/15 1646

## 2015-03-14 NOTE — ED Notes (Signed)
States hx of thyroid cancer   Pt does not have a treatment plan set up yet  Has been weak and having more pain,.positive nausea

## 2015-03-16 ENCOUNTER — Encounter: Payer: Self-pay | Admitting: Emergency Medicine

## 2015-03-16 ENCOUNTER — Emergency Department: Payer: Self-pay

## 2015-03-16 ENCOUNTER — Emergency Department
Admission: EM | Admit: 2015-03-16 | Discharge: 2015-03-16 | Disposition: A | Discharge status: Home / Self Care | Payer: Self-pay | Attending: Internal Medicine | Admitting: Internal Medicine

## 2015-03-16 DIAGNOSIS — R531 Weakness: Secondary | ICD-10-CM | POA: Insufficient documentation

## 2015-03-16 DIAGNOSIS — Z87891 Personal history of nicotine dependence: Secondary | ICD-10-CM | POA: Insufficient documentation

## 2015-03-16 DIAGNOSIS — R202 Paresthesia of skin: Secondary | ICD-10-CM | POA: Insufficient documentation

## 2015-03-16 LAB — BASIC METABOLIC PANEL
ANION GAP: 7 (ref 5–15)
BUN: 15 mg/dL (ref 6–20)
CHLORIDE: 105 mmol/L (ref 101–111)
CO2: 27 mmol/L (ref 22–32)
Calcium: 9.4 mg/dL (ref 8.9–10.3)
Creatinine, Ser: 0.76 mg/dL (ref 0.44–1.00)
GFR calc non Af Amer: >60 mL/min (ref >60)
Glucose, Bld: 93 mg/dL (ref 65–99)
Potassium: 4 mmol/L (ref 3.5–5.1)
Sodium: 139 mmol/L (ref 135–145)

## 2015-03-16 LAB — CBC
HCT: 37.4 % (ref 35.0–47.0)
HEMOGLOBIN: 12.7 g/dL (ref 12.0–16.0)
MCH: 29.7 pg (ref 26.0–34.0)
MCHC: 33.9 g/dL (ref 32.0–36.0)
MCV: 87.6 fL (ref 80.0–100.0)
Platelets: 196 10*3/uL (ref 150–440)
RBC: 4.27 MIL/uL (ref 3.80–5.20)
RDW: 13.1 % (ref 11.5–14.5)
WBC: 5.9 10*3/uL (ref 3.6–11.0)

## 2015-03-16 MED ORDER — GADOBENATE DIMEGLUMINE 529 MG/ML IV SOLN
15.0000 mL | Freq: Once | INTRAVENOUS | Status: AC | PRN
  Administered 2015-03-16: 12 mL via INTRAVENOUS

## 2015-03-16 NOTE — Discharge Instructions (Signed)
Return to the emergency room if have any new or worrisome symptoms including increased numbness or weakness difficulty talking or difficulty speaking. Do not feel to follow-up with neurology, your primary care doctor and your endocrinologist tomorrow.

## 2015-03-16 NOTE — ED Notes (Signed)
Pt sent over by [facility] for evaluation of weakness. Dr sent over for MRI, states she spoke with someone in the ER.  Pt c/o weakness. Pt has multiple visits to ED over the past few weeks. No acute distress noted. vss.

## 2015-03-16 NOTE — ED Provider Notes (Signed)
[facility] Emergency Department Provider Note     Time seen: ----------------------------------------- 1:32 PM on 03/16/2015 -----------------------------------------    I have reviewed the triage vital signs and the nursing notes.   HISTORY  Chief Complaint Weakness    HPI Anne Hamilton is a 29 y.o. female sent to the ED for an evaluation of weakness. She was sent here for an MRI. She recently has had a diagnosis of thyroid cancer, presents with left-sided weakness and paresthesias. She's had multiple visits to the ER over the past few weeks. Denies fevers chills or other complaints.   Past Medical History  Diagnosis Date  . Thyroid disease   . Pelvic pain in female   . Ovarian cyst, right   . Irregular periods   . Thyroid cancer     Patient Active Problem List   Diagnosis Date Noted  . Thyroid goiter 02/18/2015  . Anterior neck pain 02/18/2015  . Irregular menses 02/18/2015    Past Surgical History  Procedure Laterality Date  . Tonsillectomy    . Tubes in ears.    . Thyroid surgery      Allergies Diclofenac and Oxycodone-acetaminophen  Social History Social History  Substance Use Topics  . Smoking status: Former Research scientist (life sciences)  . Smokeless tobacco: Never Used     Comment: patient states she quit 35yrs ago.  . Alcohol Use: No     Comment: rarely    Review of Systems Constitutional: Negative for fever. Eyes: Negative for visual changes. ENT: Negative for sore throat. Cardiovascular: Negative for chest pain. Respiratory: Negative for shortness of breath. Gastrointestinal: Negative for abdominal pain, vomiting and diarrhea. Genitourinary: Negative for dysuria. Musculoskeletal: Negative for back pain. Skin: Negative for rash. Neurological: positive for left-sided weakness and numbness  10-point ROS otherwise negative.  ____________________________________________   PHYSICAL EXAM:  VITAL SIGNS: ED Triage Vitals  Enc Vitals  Group     BP 03/16/15 1155 134/92 mmHg     Pulse Rate 03/16/15 1155 83     Resp 03/16/15 1155 20     Temp 03/16/15 1155 99.3 F (37.4 C)     Temp Source 03/16/15 1155 Oral     SpO2 03/16/15 1155 100 %     Weight 03/16/15 1155 134 lb (60.782 kg)     Height 03/16/15 1155 5\' 6"  (1.676 m)     Head Cir --      Peak Flow --      Pain Score 03/16/15 1210 6     Pain Loc --      Pain Edu? --      Excl. in [place]? --     Constitutional: Alert and oriented. Well appearing and in no distress. Eyes: Conjunctivae are normal. PERRL. Normal extraocular movements. ENT   Head: Normocephalic and atraumatic.   Nose: No congestion/rhinnorhea.   Mouth/Throat: Mucous membranes are moist.   Neck: No stridor. Cardiovascular: Normal rate, regular rhythm. Normal and symmetric distal pulses are present in all extremities. No murmurs, rubs, or gallops. Respiratory: Normal respiratory effort without tachypnea nor retractions. Breath sounds are clear and equal bilaterally. No wheezes/rales/rhonchi. Gastrointestinal: Soft and nontender. No distention. No abdominal bruits.  Musculoskeletal: Nontender with normal range of motion in all extremities. No joint effusions.  No lower extremity tenderness nor edema. Neurologic:  Normal speech and language, mildly decreased strength on the left upper and left lower extremity compared to the right. Mild paresthesias in left arm and left leg as well as less of the face. No  facial droop is appreciated. Skin:  Skin is warm, dry and intact. No rash noted. Psychiatric: Mood and affect are normal. Speech and behavior are normal. Patient exhibits appropriate insight and judgment.  ____________________________________________  ED COURSE:  Pertinent labs & imaging results that were available during my care of the patient were reviewed by me and considered in my medical decision making (see chart for details). We'll proceed with MRI/MRA and  reevaluate. ____________________________________________    LABS (pertinent positives/negatives)  Labs Reviewed  BASIC METABOLIC PANEL  CBC    RADIOLOGY Images were viewed by me  MRI/MRA  ____________________________________________  FINAL ASSESSMENT AND PLAN  Weakness, paresthesias  Plan: Patient with labs and imaging as dictated above. Labs here are unremarkable. Her disposition is pending MRI results.   Earleen Newport, MD   Earleen Newport, MD 03/16/15 [phone]

## 2015-03-16 NOTE — ED Provider Notes (Signed)
-----------------------------------------   4:20 PM on 03/16/2015 -----------------------------------------  Pt has moved to this area in March of this year. She reports that she had a history of a thyroidectomy secondary to cancer. She has been followed by OB/GYN here as well as a primary care physician and endocrine. There are multiple notes in the chart suggesting the patient has a history of drug-seeking behavior. The patient was here because of some somewhat variable left upper and left lower extremity weakness which she states is been there for "about 3 days". Cranial nerves II through XII are grossly intact she has possibly 4 out of 5 strength in left upper extremity although this is somewhat variable and distractible, left lower extremity strength is 5 out of 5. Finger to nose within normal limits heel to shin within normal limits, speech is normal with no word finding difficulty ordered dysarthria, reflexes symmetric pupils are equally round and reactive to light there is no pronator drift, sensation is normal,   Patient reassuring neurologic exam. There is a question of left upper surgery weakness, however, it is somewhat variable. MRI/MRA of the head and neck are negative. I did discuss with radiologist and personally revealing. There is no evidence of CVA, patient is not a candidate for TPA. Patient is very interested in obtaining narcotic pain medication. There are multiple notes in the chart suggesting the patient has been having drug seeking behavior multiple different facilities. Since the last month, August 26, she has had over 70 narcotic Percocet pills prescribed to her by 4 different providers. The first thing the patient told me was that she wanted pain meds in her IV. At this time, there is no indication for admission, the patient has no MRI evidence of CVA despite 3 days of symptoms nor certainly evidence of spinal stenosis nor is there any evidence of mass effect or dissection. I did  discuss with her endocrinologist, who knows patient well and will follow up with her for her endocrinologic pathology. However, at this time we will discharge her home. Patient is instructed to follow-up with neurologist in short order for her variable weakness and she is instructed to return for any new or worrisome symptoms. She has been instructed she will not be receiving narcotic pain medication from this facility at this time. she voices understanding  Schuyler Amor, MD 03/16/15 [phone]

## 2015-04-06 ENCOUNTER — Encounter: Payer: Self-pay | Admitting: Emergency Medicine

## 2015-04-06 ENCOUNTER — Emergency Department
Admission: EM | Admit: 2015-04-06 | Discharge: 2015-04-06 | Disposition: A | Discharge status: Home / Self Care | Payer: Self-pay | Attending: Emergency Medicine | Admitting: Emergency Medicine

## 2015-04-06 DIAGNOSIS — R112 Nausea with vomiting, unspecified: Secondary | ICD-10-CM | POA: Insufficient documentation

## 2015-04-06 DIAGNOSIS — Z87891 Personal history of nicotine dependence: Secondary | ICD-10-CM | POA: Insufficient documentation

## 2015-04-06 DIAGNOSIS — Z3202 Encounter for pregnancy test, result negative: Secondary | ICD-10-CM | POA: Insufficient documentation

## 2015-04-06 DIAGNOSIS — Z79899 Other long term (current) drug therapy: Secondary | ICD-10-CM | POA: Insufficient documentation

## 2015-04-06 LAB — CBC
HCT: 38.8 % (ref 35.0–47.0)
Hemoglobin: 13.6 g/dL (ref 12.0–16.0)
MCH: 30.2 pg (ref 26.0–34.0)
MCHC: 35.1 g/dL (ref 32.0–36.0)
MCV: 85.9 fL (ref 80.0–100.0)
PLATELETS: 193 10*3/uL (ref 150–440)
RBC: 4.51 MIL/uL (ref 3.80–5.20)
RDW: 12.7 % (ref 11.5–14.5)
WBC: 5.3 10*3/uL (ref 3.6–11.0)

## 2015-04-06 LAB — URINALYSIS COMPLETE WITH MICROSCOPIC (ARMC ONLY)
BILIRUBIN URINE: NEGATIVE
GLUCOSE, UA: NEGATIVE mg/dL
KETONES UR: NEGATIVE mg/dL
Nitrite: NEGATIVE
PROTEIN: NEGATIVE mg/dL
Specific Gravity, Urine: 1.004 — ABNORMAL LOW (ref 1.005–1.030)
pH: 7 (ref 5.0–8.0)

## 2015-04-06 LAB — COMPREHENSIVE METABOLIC PANEL
ALBUMIN: 5.1 g/dL — AB (ref 3.5–5.0)
ALK PHOS: 54 U/L (ref 38–126)
ALT: 14 U/L (ref 14–54)
AST: 19 U/L (ref 15–41)
Anion gap: 9 (ref 5–15)
BILIRUBIN TOTAL: 1.6 mg/dL — AB (ref 0.3–1.2)
BUN: 14 mg/dL (ref 6–20)
CALCIUM: 10.1 mg/dL (ref 8.9–10.3)
CO2: 27 mmol/L (ref 22–32)
CREATININE: 0.65 mg/dL (ref 0.44–1.00)
Chloride: 104 mmol/L (ref 101–111)
GFR calc Af Amer: >60 mL/min (ref >60)
GFR calc non Af Amer: >60 mL/min (ref >60)
GLUCOSE: 110 mg/dL — AB (ref 65–99)
Potassium: 3.8 mmol/L (ref 3.5–5.1)
SODIUM: 140 mmol/L (ref 135–145)
TOTAL PROTEIN: 8.9 g/dL — AB (ref 6.5–8.1)

## 2015-04-06 LAB — PREGNANCY, URINE: Preg Test, Ur: NEGATIVE

## 2015-04-06 MED ORDER — TRAMADOL HCL 50 MG PO TABS
50.0000 mg | ORAL_TABLET | Freq: Once | ORAL | Status: AC
  Administered 2015-04-06: 50 mg via ORAL
  Filled 2015-04-06: qty 1

## 2015-04-06 MED ORDER — ONDANSETRON HCL 4 MG PO TABS: 4.0000 mg | ORAL_TABLET | Freq: Three times a day (TID) | ORAL | Status: AC | PRN

## 2015-04-06 MED ORDER — SUCRALFATE 1 GM/10ML PO SUSP: 1.0000 g | Freq: Three times a day (TID) | ORAL | Status: AC

## 2015-04-06 MED ORDER — ONDANSETRON 4 MG PO TBDP
4.0000 mg | ORAL_TABLET | Freq: Once | ORAL | Status: AC
  Administered 2015-04-06: 4 mg via ORAL
  Filled 2015-04-06: qty 1

## 2015-04-06 MED ORDER — TRAMADOL HCL 50 MG PO TABS: 50.0000 mg | ORAL_TABLET | Freq: Four times a day (QID) | ORAL | Status: DC | PRN

## 2015-04-06 NOTE — ED Provider Notes (Signed)
[facility] Emergency Department Provider Note   ____________________________________________  Time seen: 1:30 PM I have reviewed the triage vital signs and the triage nursing note.  HISTORY  Chief Complaint Nausea and Emesis   Historian Patient  HPI Anne Hamilton is a 29 y.o. female who has a history of recurrent thyroid cancer, for which she is to see a Psychologist, sport and exercise on Friday. Patient has ongoing pain from this and states that since she moved here, she has been utilizing the refills on the prescription of tramadol from her prior physician. She has run out of tramadol and for the last 2 days has been having abdominal pain in the epigastric area as well as nausea with vomiting. No fever. No bloody or bilious emesis. She is concerned she might be dehydrated. She states she called her primary office at [facility] clinic, and was referred to come to the ED for evaluation.    Past Medical History  Diagnosis Date  . Thyroid disease   . Pelvic pain in female   . Ovarian cyst, right   . Irregular periods   . Thyroid cancer [facility])     Patient Active Problem List   Diagnosis Date Noted  . Thyroid goiter 02/18/2015  . Anterior neck pain 02/18/2015  . Irregular menses 02/18/2015    Past Surgical History  Procedure Laterality Date  . Tonsillectomy    . Tubes in ears.    . Thyroid surgery      Current Outpatient Rx  Name  Route  Sig  Dispense  Refill  . acetaminophen (RA ACETAMINOPHEN) 650 MG CR tablet   Oral   Take 650 mg by mouth every 8 (eight) hours as needed. For pain.         Marland Kitchen HYDROcodone-acetaminophen (NORCO/VICODIN) 5-325 MG per tablet   Oral   Take 1 tablet by mouth every 4 (four) hours as needed for moderate pain.   20 tablet   0   . ibuprofen (ADVIL,MOTRIN) 200 MG tablet   Oral   Take 800-1,000 mg by mouth every 6 (six) hours as needed for mild pain or moderate pain.          Marland Kitchen levothyroxine (SYNTHROID) 100 MCG tablet   Oral   Take 1  tablet (100 mcg total) by mouth daily.   30 tablet   0   . ondansetron (ZOFRAN) 4 MG tablet   Oral   Take 1 tablet (4 mg total) by mouth every 8 (eight) hours as needed for nausea or vomiting.   10 tablet   0   . predniSONE (DELTASONE) 20 MG tablet   Oral   Take 20 mg by mouth daily with breakfast.         . sucralfate (CARAFATE) 1 GM/10ML suspension   Oral   Take 10 mLs (1 g total) by mouth 4 (four) times daily -  with meals and at bedtime.   420 mL   0   . traMADol (ULTRAM) 50 MG tablet   Oral   Take 1 tablet (50 mg total) by mouth every 6 (six) hours as needed.   8 tablet   0     Allergies Diclofenac and Oxycodone-acetaminophen  Family History  Problem Relation Age of Onset  . Adopted: Yes    Social History Social History  Substance Use Topics  . Smoking status: Former Research scientist (life sciences)  . Smokeless tobacco: Never Used     Comment: patient states she quit 60yrs ago.  . Alcohol Use: No  Comment: rarely    Review of Systems  Constitutional: Negative for fever. Eyes: Negative for visual changes. ENT: Negative for sore throat. Cardiovascular: Negative for chest pain. Respiratory: Negative for shortness of breath. Gastrointestinal: One loose bowel movement yesterday with small amount of bright red blood per rectum. Patient is unsure that this may have been her menstrual bleeding. Genitourinary: Negative for dysuria. No hematuria Musculoskeletal: Negative for back pain. Skin: Negative for rash. Neurological: Negative for headache. 10 point Review of Systems otherwise negative ____________________________________________   PHYSICAL EXAM:  VITAL SIGNS: ED Triage Vitals  Enc Vitals Group     BP 04/06/15 1128 140/93 mmHg     Pulse Rate 04/06/15 1128 87     Resp 04/06/15 1128 16     Temp 04/06/15 1128 98 F (36.7 C)     Temp Source 04/06/15 1128 Oral     SpO2 04/06/15 1128 100 %     Weight --      Height 04/06/15 1128 5\' 6"  (1.676 m)     Head Cir --       Peak Flow --      Pain Score --      Pain Loc --      Pain Edu? --      Excl. in [place]? --      Constitutional: Alert and oriented. Well appearing and in no distress. Eyes: Conjunctivae are normal. PERRL. Normal extraocular movements. ENT   Head: Normocephalic and atraumatic.   Nose: No congestion/rhinnorhea.   Mouth/Throat: Mucous membranes are moist. Poor dentition.   Neck: No stridor. Cardiovascular/Chest: Normal rate, regular rhythm.  No murmurs, rubs, or gallops. Respiratory: Normal respiratory effort without tachypnea nor retractions. Breath sounds are clear and equal bilaterally. No wheezes/rales/rhonchi. Gastrointestinal: Soft. No distention, no guarding, no rebound. Nontender to palpation in 4 quadrants.  Genitourinary/rectal: Deferred pelvic exam. Rectal exam nontender with brown stool which is heme negative. Musculoskeletal: Nontender with normal range of motion in all extremities. No joint effusions.  No lower extremity tenderness.  No edema. Neurologic:  Normal speech and language. No gross or focal neurologic deficits are appreciated. Skin:  Skin is warm, dry and intact. No rash noted. Psychiatric: Mood and affect are normal. Speech and behavior are normal. Patient exhibits appropriate insight and judgment.  ____________________________________________   EKG I, Lisa Roca, MD, the attending physician have personally viewed and interpreted all ECGs.  No EKG performed ____________________________________________  LABS (pertinent positives/negatives)  Complete metabolic panel without significant abnormalities CBC within normal limits Urinalysis trace leukocytes, 6-30 red blood cells, rare bacteria, 6-30 squamous epithelial cells.  Pregnancy test: Negative  ____________________________________________  RADIOLOGY All Xrays were viewed by me. Imaging interpreted by  Radiologist.  None __________________________________________  PROCEDURES  Procedure(s) performed: None  Critical Care performed: None  ____________________________________________   ED COURSE / ASSESSMENT AND PLAN  CONSULTATIONS: None  Pertinent labs & imaging results that were available during my care of the patient were reviewed by me and considered in my medical decision making (see chart for details).   The patient states she was trying to follow-up with [facility] clinic for nausea and vomiting for 2 days, and she was sent to the ED for further evaluation. It sounds like the patient thinks that it may be pain that is causing her to have nausea and vomiting. She is out of her tramadol. She does have a brother with a Psychologist, sport and exercise for the recurrent thyroid cancer on Friday. I discussed with her giving her enough for 2  days.  In terms of the abdominal discomfort, she has a benign abdomen on exam, and reassuring laboratory studies. Patient is not having urinary symptoms, and I suspect her urinalysis is contaminated. A culture will be sent.  We discussed trying Zofran ODT and possibly Carafate for symptoms. However when her in the office number for GI for follow-up if she has continued nausea.  Patient / Family / Caregiver informed of clinical course, medical decision-making process, and agree with plan.   I discussed return precautions, follow-up instructions, and discharged instructions with patient and/or family.  ___________________________________________   FINAL CLINICAL IMPRESSION(S) / ED DIAGNOSES   Final diagnoses:  Nausea and vomiting, vomiting of unspecified type       Lisa Roca, MD 04/06/15 1423

## 2015-04-06 NOTE — ED Notes (Signed)
Pt reports having thyroid ca, Pt states having abdominal pain, emesis and bloody stool x "a few days"

## 2015-04-06 NOTE — Discharge Instructions (Signed)
You were evaluated for nausea and vomiting and no certain cause was found, however your exam and evaluation are reassuring. We discussed trying symptomatic medications Zofran and Carafate for symptoms. Return to emergency department for any worsening condition including blood or green vomiting, bloody stool, dizziness, passing out, worsening abdominal pain, fever, or any other symptoms concerning to you.   Nausea and Vomiting Nausea is a sick feeling that often comes before throwing up (vomiting). Vomiting is a reflex where stomach contents come out of your mouth. Vomiting can cause severe loss of body fluids (dehydration). Children and elderly adults can become dehydrated quickly, especially if they also have diarrhea. Nausea and vomiting are symptoms of a condition or disease. It is important to find the cause of your symptoms. CAUSES   Direct irritation of the stomach lining. This irritation can result from increased acid production (gastroesophageal reflux disease), infection, food poisoning, taking certain medicines (such as nonsteroidal anti-inflammatory drugs), alcohol use, or tobacco use.  Signals from the brain.These signals could be caused by a headache, heat exposure, an inner ear disturbance, increased pressure in the brain from injury, infection, a tumor, or a concussion, pain, emotional stimulus, or metabolic problems.  An obstruction in the gastrointestinal tract (bowel obstruction).  Illnesses such as diabetes, hepatitis, gallbladder problems, appendicitis, kidney problems, cancer, sepsis, atypical symptoms of a heart attack, or eating disorders.  Medical treatments such as chemotherapy and radiation.  Receiving medicine that makes you sleep (general anesthetic) during surgery. DIAGNOSIS Your caregiver may ask for tests to be done if the problems do not improve after a few days. Tests may also be done if symptoms are severe or if the reason for the nausea and vomiting is not  clear. Tests may include:  Urine tests.  Blood tests.  Stool tests.  Cultures (to look for evidence of infection).  X-rays or other imaging studies. Test results can help your caregiver make decisions about treatment or the need for additional tests. TREATMENT You need to stay well hydrated. Drink frequently but in small amounts.You may wish to drink water, sports drinks, clear broth, or eat frozen ice pops or gelatin dessert to help stay hydrated.When you eat, eating slowly may help prevent nausea.There are also some antinausea medicines that may help prevent nausea. HOME CARE INSTRUCTIONS   Take all medicine as directed by your caregiver.  If you do not have an appetite, do not force yourself to eat. However, you must continue to drink fluids.  If you have an appetite, eat a normal diet unless your caregiver tells you differently.  Eat a variety of complex carbohydrates (rice, wheat, potatoes, bread), lean meats, yogurt, fruits, and vegetables.  Avoid high-fat foods because they are more difficult to digest.  Drink enough water and fluids to keep your urine clear or pale yellow.  If you are dehydrated, ask your caregiver for specific rehydration instructions. Signs of dehydration may include:  Severe thirst.  Dry lips and mouth.  Dizziness.  Dark urine.  Decreasing urine frequency and amount.  Confusion.  Rapid breathing or pulse. SEEK IMMEDIATE MEDICAL CARE IF:   You have blood or brown flecks (like coffee grounds) in your vomit.  You have black or bloody stools.  You have a severe headache or stiff neck.  You are confused.  You have severe abdominal pain.  You have chest pain or trouble breathing.  You do not urinate at least once every 8 hours.  You develop cold or clammy skin.  You continue to  vomit for longer than 24 to 48 hours.  You have a fever. MAKE SURE YOU:   Understand these instructions.  Will watch your condition.  Will get help  right away if you are not doing well or get worse.   This information is not intended to replace advice given to you by your health care provider. Make sure you discuss any questions you have with your health care provider.   Document Released: 06/11/2005 Document Revised: 09/03/2011 Document Reviewed: 11/08/2010 Elsevier Interactive Patient Education [employer].

## 2015-04-06 NOTE — ED Notes (Signed)
Says increased pain and sweliing neck.  Dx thyroid cancer recent.  Last 2 days weight loss due to n/v.

## 2015-04-21 ENCOUNTER — Encounter (HOSPITAL_COMMUNITY): Payer: Self-pay | Admitting: *Deleted

## 2015-04-21 ENCOUNTER — Emergency Department (HOSPITAL_COMMUNITY)
Admission: EM | Admit: 2015-04-21 | Discharge: 2015-04-21 | Disposition: A | Discharge status: Home / Self Care | Payer: Self-pay | Attending: Emergency Medicine | Admitting: Emergency Medicine

## 2015-04-21 ENCOUNTER — Emergency Department (HOSPITAL_COMMUNITY): Payer: Self-pay

## 2015-04-21 DIAGNOSIS — Z9622 Myringotomy tube(s) status: Secondary | ICD-10-CM | POA: Insufficient documentation

## 2015-04-21 DIAGNOSIS — E079 Disorder of thyroid, unspecified: Secondary | ICD-10-CM | POA: Insufficient documentation

## 2015-04-21 DIAGNOSIS — Z8742 Personal history of other diseases of the female genital tract: Secondary | ICD-10-CM | POA: Insufficient documentation

## 2015-04-21 DIAGNOSIS — C73 Malignant neoplasm of thyroid gland: Secondary | ICD-10-CM | POA: Insufficient documentation

## 2015-04-21 DIAGNOSIS — Z87891 Personal history of nicotine dependence: Secondary | ICD-10-CM | POA: Insufficient documentation

## 2015-04-21 DIAGNOSIS — R42 Dizziness and giddiness: Secondary | ICD-10-CM | POA: Insufficient documentation

## 2015-04-21 DIAGNOSIS — R000 Tachycardia, unspecified: Secondary | ICD-10-CM | POA: Insufficient documentation

## 2015-04-21 DIAGNOSIS — Z79899 Other long term (current) drug therapy: Secondary | ICD-10-CM | POA: Insufficient documentation

## 2015-04-21 DIAGNOSIS — R2689 Other abnormalities of gait and mobility: Secondary | ICD-10-CM | POA: Insufficient documentation

## 2015-04-21 DIAGNOSIS — K920 Hematemesis: Secondary | ICD-10-CM | POA: Insufficient documentation

## 2015-04-21 LAB — CBC WITH DIFFERENTIAL/PLATELET
BASOS ABS: 0 10*3/uL (ref 0.0–0.1)
BASOS PCT: 0 %
Eosinophils Absolute: 0.1 10*3/uL (ref 0.0–0.7)
Eosinophils Relative: 3 %
HEMATOCRIT: 38.7 % (ref 36.0–46.0)
HEMOGLOBIN: 13.5 g/dL (ref 12.0–15.0)
LYMPHS PCT: 26 %
Lymphs Abs: 1.2 10*3/uL (ref 0.7–4.0)
MCH: 30 pg (ref 26.0–34.0)
MCHC: 34.9 g/dL (ref 30.0–36.0)
MCV: 86 fL (ref 78.0–100.0)
MONO ABS: 0.2 10*3/uL (ref 0.1–1.0)
MONOS PCT: 5 %
NEUTROS ABS: 3.2 10*3/uL (ref 1.7–7.7)
NEUTROS PCT: 66 %
Platelets: 210 10*3/uL (ref 150–400)
RBC: 4.5 MIL/uL (ref 3.87–5.11)
RDW: 11.6 % (ref 11.5–15.5)
WBC: 4.8 10*3/uL (ref 4.0–10.5)

## 2015-04-21 LAB — COMPREHENSIVE METABOLIC PANEL
ALBUMIN: 4.8 g/dL (ref 3.5–5.0)
ALK PHOS: 49 U/L (ref 38–126)
ALT: 15 U/L (ref 14–54)
AST: 20 U/L (ref 15–41)
Anion gap: 7 (ref 5–15)
BILIRUBIN TOTAL: 1 mg/dL (ref 0.3–1.2)
BUN: 11 mg/dL (ref 6–20)
CO2: 27 mmol/L (ref 22–32)
Calcium: 9.5 mg/dL (ref 8.9–10.3)
Chloride: 106 mmol/L (ref 101–111)
Creatinine, Ser: 0.78 mg/dL (ref 0.44–1.00)
GFR calc Af Amer: >60 mL/min (ref >60)
GLUCOSE: 111 mg/dL — AB (ref 65–99)
POTASSIUM: 3.8 mmol/L (ref 3.5–5.1)
Sodium: 140 mmol/L (ref 135–145)
Total Protein: 7.9 g/dL (ref 6.5–8.1)

## 2015-04-21 LAB — URINALYSIS W MICROSCOPIC (NOT AT ARMC)
Bilirubin Urine: NEGATIVE
Glucose, UA: NEGATIVE mg/dL
Hgb urine dipstick: NEGATIVE
KETONES UR: NEGATIVE mg/dL
NITRITE: NEGATIVE
PROTEIN: NEGATIVE mg/dL
Specific Gravity, Urine: 1.01 (ref 1.005–1.030)
UROBILINOGEN UA: 0.2 mg/dL (ref 0.0–1.0)
pH: 7.5 (ref 5.0–8.0)

## 2015-04-21 MED ORDER — HYDROCODONE-ACETAMINOPHEN 5-325 MG PO TABS: 1.0000 | ORAL_TABLET | ORAL | Status: DC | PRN

## 2015-04-21 MED ORDER — SODIUM CHLORIDE 0.9 % IV BOLUS (SEPSIS)
1000.0000 mL | Freq: Once | INTRAVENOUS | Status: AC
  Administered 2015-04-21: 1000 mL via INTRAVENOUS

## 2015-04-21 MED ORDER — ONDANSETRON HCL 4 MG/2ML IJ SOLN: 4.0000 mg | Freq: Once | INTRAMUSCULAR | Status: DC

## 2015-04-21 MED ORDER — PROMETHAZINE HCL 25 MG/ML IJ SOLN
25.0000 mg | Freq: Once | INTRAMUSCULAR | Status: AC
  Administered 2015-04-21: 25 mg via INTRAVENOUS
  Filled 2015-04-21: qty 1

## 2015-04-21 MED ORDER — HYDROCODONE-ACETAMINOPHEN 5-325 MG PO TABS
1.0000 | ORAL_TABLET | Freq: Once | ORAL | Status: AC
  Administered 2015-04-21: 1 via ORAL
  Filled 2015-04-21: qty 1

## 2015-04-21 MED ORDER — PROMETHAZINE HCL 25 MG PO TABS
25.0000 mg | ORAL_TABLET | Freq: Four times a day (QID) | ORAL | Status: AC | PRN
Start: 2015-04-21 — End: ?

## 2015-04-21 MED ORDER — HYDROMORPHONE HCL 1 MG/ML IJ SOLN
1.0000 mg | Freq: Once | INTRAMUSCULAR | Status: AC
  Administered 2015-04-21: 1 mg via INTRAVENOUS
  Filled 2015-04-21: qty 1

## 2015-04-21 NOTE — ED Notes (Signed)
Pt tolerated crackers at this time.

## 2015-04-21 NOTE — ED Notes (Signed)
Pt states dizziness began at 0900 while sitting. Hx of dizziness in the past.  Pt states vomiting began ~1000, states multiple times since then (~12). States last time she vomited she saw "a long line of bright red blood". Pt states pain to left chest area. Recently dx with thyroid CA with hx of partial thyroidectomy. States surgery is scheduled for next month. NAD at this time. Pt states she was concerned because she has never had pain, vomiting and dizziness all together.

## 2015-04-21 NOTE — ED Provider Notes (Signed)
CSN: [id number]     Arrival date & time 04/21/15  1303 History   First MD Initiated Contact with Patient 04/21/15 1333     Chief Complaint  Patient presents with  . Dizziness  . Emesis     (Consider location/radiation/quality/duration/timing/severity/associated sxs/prior Treatment) HPI 29 year old female who presents with dizziness, nausea, and vomiting. History of thyroid mass c/f malignancy with plans to have resection in November. Has had neck pain associated with this mass. Has had intermittent dizziness in the past as well. Reports that today developed dizziness at rest again. Denies room spinning, but states that she felt "woozy" and that the room was tilting. Associated with nausea, vomiting, and gait instability. Noted that she had a streak of blood within in her vomit. No falls, syncope, vision/speech changes, headache, numbness or weakness. No recent fever, chills, cough,congestion, runny nose, difficulty breathing, diarrhea, abdominal pain or urinary symptoms. States that her neck mass felt more painful as well, and radiated from the left side of her neck down to the left chest wall. States these combination of symptoms concerned her and brought her to the ED for evaluation. Past Medical History  Diagnosis Date  . Thyroid disease   . Pelvic pain in female   . Ovarian cyst, right   . Irregular periods   . Thyroid cancer [facility])    Past Surgical History  Procedure Laterality Date  . Tonsillectomy    . Tubes in ears.    . Thyroid surgery     Family History  Problem Relation Age of Onset  . Adopted: Yes   Social History  Substance Use Topics  . Smoking status: Former Research scientist (life sciences)  . Smokeless tobacco: Never Used     Comment: patient states she quit 19yrs ago.  . Alcohol Use: No     Comment: rarely   OB History    Gravida Para Term Preterm AB TAB SAB Ectopic Multiple Living   0 0 0 0 0 0 0 0 0 0      Review of Systems 10/14 systems reviewed and are negative other than those  stated in the HPI    Allergies  Diclofenac and Oxycodone-acetaminophen  Home Medications   Prior to Admission medications   Medication Sig Start Date End Date Taking? Authorizing Provider  ibuprofen (ADVIL,MOTRIN) 200 MG tablet Take 800-1,000 mg by mouth every 6 (six) hours as needed for mild pain or moderate pain.    Yes Historical Provider, MD  ondansetron (ZOFRAN) 4 MG tablet Take 1 tablet (4 mg total) by mouth every 8 (eight) hours as needed for nausea or vomiting. 04/06/15  Yes Lisa Roca, MD  sucralfate (CARAFATE) 1 GM/10ML suspension Take 10 mLs (1 g total) by mouth 4 (four) times daily -  with meals and at bedtime. 04/06/15 04/05/16 Yes Lisa Roca, MD  HYDROcodone-acetaminophen (NORCO/VICODIN) 5-325 MG tablet Take 1 tablet by mouth every 4 (four) hours as needed for moderate pain or severe pain. 04/21/15   Forde Dandy, MD  levothyroxine (SYNTHROID) 100 MCG tablet Take 1 tablet (100 mcg total) by mouth daily. Patient not taking: Reported on 04/21/2015 02/25/15 02/25/16  Delman Kitten, MD  promethazine (PHENERGAN) 25 MG tablet Take 1 tablet (25 mg total) by mouth every 6 (six) hours as needed for nausea or vomiting. 04/21/15   Forde Dandy, MD  traMADol (ULTRAM) 50 MG tablet Take 1 tablet (50 mg total) by mouth every 6 (six) hours as needed. Patient not taking: Reported on 04/21/2015 04/06/15  Lisa Roca, MD   BP 108/76 mmHg  Pulse 84  Temp(Src) 98.1 F (36.7 C) (Oral)  Resp 14  Ht 5\' 6"  (1.676 m)  Wt 130 lb (58.968 kg)  BMI 20.99 kg/m2  SpO2 100%  LMP 04/05/2015 Physical Exam Physical Exam  Nursing note and vitals reviewed. Constitutional: Well developed, well nourished, non-toxic, and in no acute distress Head: Normocephalic and atraumatic.  Eyes: EOMI, PERRL, no nystagmus Ears: bilateral TM normal, tympanostomy tube in the right ear Mouth/Throat: Oropharynx is clear and moist.  Neck: Normal range of motion. Neck supple.  Cardiovascular: Normal rate and regular  rhythm.   Pulmonary/Chest: Effort normal and breath sounds normal.  Abdominal: Soft. There is no tenderness. There is no rebound and no guarding.  Musculoskeletal: Normal range of motion.  Skin: Skin is warm and dry.  Psychiatric: Cooperative Neurological:  Alert, oriented to person, place, time, and situation. Memory grossly in tact. Fluent speech. No dysarthria or aphasia.  Cranial nerves: VF are full.  Pupils are symmetric, and reactive to light. EOMI without nystagmus. No gaze deviation. Facial muscles symmetric with activation. Sensation to light touch over face in tact bilaterally. Hearing grossly in tact. Palate elevates symmetrically. Head turn and shoulder shrug are intact. Tongue midline.  Muscle bulk and tone normal. No pronator drift. Moves all extremities symmetrically. Sensation to light touch is in tact throughout in bilateral upper and lower extremities. Coordination reveals no dysmetria with finger to nose. Gait is narrow-based and steady. Non-ataxic.    ED Course  Procedures (including critical care time) Labs Review Labs Reviewed  COMPREHENSIVE METABOLIC PANEL - Abnormal; Notable for the following:    Glucose, Bld 111 (*)    All other components within normal limits  URINALYSIS W MICROSCOPIC - Abnormal; Notable for the following:    Leukocytes, UA TRACE (*)    All other components within normal limits  CBC WITH DIFFERENTIAL/PLATELET    Imaging Review Dg Chest 2 View  04/21/2015  CLINICAL DATA:  Left-sided chest pain, congestion and vertigo. EXAM: CHEST  2 VIEW COMPARISON:  None. FINDINGS: Cardiomediastinal silhouette is normal. Mediastinal contours appear intact. There is no evidence of focal airspace consolidation, pleural effusion or pneumothorax. Osseous structures are without acute abnormality. Soft tissues are grossly normal. IMPRESSION: No active cardiopulmonary disease. Electronically Signed   By: Fidela Salisbury M.D.   On: 04/21/2015 14:46   I have  personally reviewed and evaluated these images and lab results as part of my medical decision-making.   EKG Interpretation   Date/Time:  Thursday April 21 2015 13:15:33 EDT Ventricular Rate:  92 PR Interval:  145 QRS Duration: 84 QT Interval:  347 QTC Calculation: 429 R Axis:   74 Text Interpretation:  Sinus rhythm Probable left atrial enlargement RSR'  in V1 or V2, probably normal variant Nonspecific T abnrm, anterolateral  leads Baseline wander in lead(s) III aVF No significant change since last  tracing Confirmed by Liany Mumpower MD, Sang Blount ([id number]) on 04/21/2015 3:26:26 PM      MDM   Final diagnoses:  Vertigo  Dizziness  Thyroid cancer ([place])   29 year old with left thyroid mass who presents with nausea, vomiting, dizziness, and neck pain. She is well appearing and in no acute distress on presentation. Vital signs showing tachycardia.  Afebrile, normotensive, and in no distress. Appears dry 2/2 vomiting. Dizziness a combination of lightheaded and vertigo. Heart rate jumps from 100 to 120 from lying to sitting up when I was examining her, suggestive of component  of orthostasis. EKG without stigmata of arrhythmia or other concerning features. Neuro in tact. No dysmetria and ambulates steadily here. No concern for central etiology of vertigo. Also appears to have MRI/MAR in 02/2015 showing no acute issues for work up of this. Symptoms improved with IVF and phenergan. Tachycardia resolves after IVF and she is able to tolerate PO in take. No major electrolyte or metabolic derangements in ED. No concern for active GI bleed, and streak of blood likely from mallory weiss tear from retching. Hgb stable and hemodynamically stable without additional episodes of vomiting. Neck mass appears stable, without concern for airway compromise. Handing secretions, breathing comfortably, no stridor, and no major voice changes. Stable for discharge with outpatient management. Given phenergan for home. Will see PCP within  one week as scheduled for follow-up.    Forde Dandy, MD 04/22/15 [phone]

## 2015-04-21 NOTE — Discharge Instructions (Signed)
Follow-up with your primary care provider as scheduled next Tuesday. Return without fail for worsening symptoms including worsening pain, vomiting and unable to keep down food or fluids, fevers, confusion, or any other symptoms concerning to you.  Dizziness Dizziness is a common problem. It is a feeling of unsteadiness or light-headedness. You may feel like you are about to faint. Dizziness can lead to injury if you stumble or fall. Anyone can become dizzy, but dizziness is more common in older adults. This condition can be caused by a number of things, including medicines, dehydration, or illness. HOME CARE INSTRUCTIONS Taking these steps may help with your condition: Eating and Drinking  Drink enough fluid to keep your urine clear or pale yellow. This helps to keep you from becoming dehydrated. Try to drink more clear fluids, such as water.  Do not drink alcohol.  Limit your caffeine intake if directed by your health care provider.  Limit your salt intake if directed by your health care provider. Activity  Avoid making quick movements.  Rise slowly from chairs and steady yourself until you feel okay.  In the morning, first sit up on the side of the bed. When you feel okay, stand slowly while you hold onto something until you know that your balance is fine.  Move your legs often if you need to stand in one place for a long time. Tighten and relax your muscles in your legs while you are standing.  Do not drive or operate heavy machinery if you feel dizzy.  Avoid bending down if you feel dizzy. Place items in your home so that they are easy for you to reach without leaning over. Lifestyle  Do not use any tobacco products, including cigarettes, chewing tobacco, or electronic cigarettes. If you need help quitting, ask your health care provider.  Try to reduce your stress level, such as with yoga or meditation. Talk with your health care provider if you need help. General  Instructions  Watch your dizziness for any changes.  Take medicines only as directed by your health care provider. Talk with your health care provider if you think that your dizziness is caused by a medicine that you are taking.  Tell a friend or a family member that you are feeling dizzy. If he or she notices any changes in your behavior, have this person call your health care provider.  Keep all follow-up visits as directed by your health care provider. This is important. SEEK MEDICAL CARE IF:  Your dizziness does not go away.  Your dizziness or light-headedness gets worse.  You feel nauseous.  You have reduced hearing.  You have new symptoms.  You are unsteady on your feet or you feel like the room is spinning. SEEK IMMEDIATE MEDICAL CARE IF:  You vomit or have diarrhea and are unable to eat or drink anything.  You have problems talking, walking, swallowing, or using your arms, hands, or legs.  You feel generally weak.  You are not thinking clearly or you have trouble forming sentences. It may take a friend or family member to notice this.  You have chest pain, abdominal pain, shortness of breath, or sweating.  Your vision changes.  You notice any bleeding.  You have a headache.  You have neck pain or a stiff neck.  You have a fever.   This information is not intended to replace advice given to you by your health care provider. Make sure you discuss any questions you have with your health care  provider.   Document Released: 12/05/2000 Document Revised: 10/26/2014 Document Reviewed: 06/07/2014 Elsevier Interactive Patient Education [employer].

## 2015-04-22 ENCOUNTER — Emergency Department (HOSPITAL_COMMUNITY)
Admission: EM | Admit: 2015-04-22 | Discharge: 2015-04-22 | Disposition: A | Discharge status: Home / Self Care | Payer: Self-pay | Attending: Emergency Medicine | Admitting: Emergency Medicine

## 2015-04-22 ENCOUNTER — Encounter (HOSPITAL_COMMUNITY): Payer: Self-pay | Admitting: Emergency Medicine

## 2015-04-22 ENCOUNTER — Emergency Department (HOSPITAL_COMMUNITY): Payer: Self-pay

## 2015-04-22 DIAGNOSIS — S199XXA Unspecified injury of neck, initial encounter: Secondary | ICD-10-CM | POA: Insufficient documentation

## 2015-04-22 DIAGNOSIS — R55 Syncope and collapse: Secondary | ICD-10-CM | POA: Insufficient documentation

## 2015-04-22 DIAGNOSIS — S0181XA Laceration without foreign body of other part of head, initial encounter: Secondary | ICD-10-CM | POA: Insufficient documentation

## 2015-04-22 DIAGNOSIS — Z87891 Personal history of nicotine dependence: Secondary | ICD-10-CM | POA: Insufficient documentation

## 2015-04-22 DIAGNOSIS — Y998 Other external cause status: Secondary | ICD-10-CM | POA: Insufficient documentation

## 2015-04-22 DIAGNOSIS — Z85850 Personal history of malignant neoplasm of thyroid: Secondary | ICD-10-CM | POA: Insufficient documentation

## 2015-04-22 DIAGNOSIS — Z8742 Personal history of other diseases of the female genital tract: Secondary | ICD-10-CM | POA: Insufficient documentation

## 2015-04-22 DIAGNOSIS — W108XXA Fall (on) (from) other stairs and steps, initial encounter: Secondary | ICD-10-CM | POA: Insufficient documentation

## 2015-04-22 DIAGNOSIS — R42 Dizziness and giddiness: Secondary | ICD-10-CM | POA: Insufficient documentation

## 2015-04-22 DIAGNOSIS — S060X9A Concussion with loss of consciousness of unspecified duration, initial encounter: Secondary | ICD-10-CM | POA: Insufficient documentation

## 2015-04-22 DIAGNOSIS — Y9289 Other specified places as the place of occurrence of the external cause: Secondary | ICD-10-CM | POA: Insufficient documentation

## 2015-04-22 DIAGNOSIS — Y9389 Activity, other specified: Secondary | ICD-10-CM | POA: Insufficient documentation

## 2015-04-22 DIAGNOSIS — Z79899 Other long term (current) drug therapy: Secondary | ICD-10-CM | POA: Insufficient documentation

## 2015-04-22 LAB — CBC WITH DIFFERENTIAL/PLATELET
Basophils Absolute: 0 10*3/uL (ref 0.0–0.1)
Basophils Relative: 0 %
EOS ABS: 0.2 10*3/uL (ref 0.0–0.7)
Eosinophils Relative: 4 %
HEMATOCRIT: 34.8 % — AB (ref 36.0–46.0)
HEMOGLOBIN: 12.1 g/dL (ref 12.0–15.0)
LYMPHS ABS: 2.6 10*3/uL (ref 0.7–4.0)
Lymphocytes Relative: 57 %
MCH: 30.3 pg (ref 26.0–34.0)
MCHC: 34.8 g/dL (ref 30.0–36.0)
MCV: 87 fL (ref 78.0–100.0)
MONO ABS: 0.2 10*3/uL (ref 0.1–1.0)
MONOS PCT: 5 %
NEUTROS PCT: 34 %
Neutro Abs: 1.6 10*3/uL — ABNORMAL LOW (ref 1.7–7.7)
Platelets: 184 10*3/uL (ref 150–400)
RBC: 4 MIL/uL (ref 3.87–5.11)
RDW: 11.6 % (ref 11.5–15.5)
WBC: 4.6 10*3/uL (ref 4.0–10.5)

## 2015-04-22 LAB — BASIC METABOLIC PANEL
Anion gap: 7 (ref 5–15)
BUN: 9 mg/dL (ref 6–20)
CHLORIDE: 106 mmol/L (ref 101–111)
CO2: 28 mmol/L (ref 22–32)
CREATININE: 0.81 mg/dL (ref 0.44–1.00)
Calcium: 8.9 mg/dL (ref 8.9–10.3)
GFR calc Af Amer: >60 mL/min (ref >60)
GFR calc non Af Amer: >60 mL/min (ref >60)
Glucose, Bld: 99 mg/dL (ref 65–99)
Potassium: 3.4 mmol/L — ABNORMAL LOW (ref 3.5–5.1)
Sodium: 141 mmol/L (ref 135–145)

## 2015-04-22 MED ORDER — ONDANSETRON 4 MG PO TBDP: 4.0000 mg | ORAL_TABLET | Freq: Three times a day (TID) | ORAL | Status: AC | PRN

## 2015-04-22 MED ORDER — HYDROCODONE-ACETAMINOPHEN 5-325 MG PO TABS
2.0000 | ORAL_TABLET | ORAL | Status: AC | PRN
Start: 2015-04-22 — End: ?

## 2015-04-22 MED ORDER — HYDROCODONE-ACETAMINOPHEN 5-325 MG PO TABS
1.0000 | ORAL_TABLET | Freq: Once | ORAL | Status: AC
  Administered 2015-04-22: 1 via ORAL
  Filled 2015-04-22: qty 1

## 2015-04-22 MED ORDER — ONDANSETRON 4 MG PO TBDP
4.0000 mg | ORAL_TABLET | Freq: Once | ORAL | Status: AC
  Administered 2015-04-22: 4 mg via ORAL
  Filled 2015-04-22: qty 1

## 2015-04-22 MED ORDER — HYDROCODONE-ACETAMINOPHEN 5-325 MG PO TABS
1.0000 | ORAL_TABLET | Freq: Once | ORAL | Status: AC
Start: 2015-04-22 — End: 2015-04-22
  Administered 2015-04-22: 1 via ORAL
  Filled 2015-04-22: qty 1

## 2015-04-22 NOTE — ED Notes (Signed)
MD at bedside. 

## 2015-04-22 NOTE — Discharge Instructions (Signed)
Concussion, Adult A concussion, or closed-head injury, is a brain injury caused by a direct blow to the head or by a quick and sudden movement (jolt) of the head or neck. Concussions are usually not life-threatening. Even so, the effects of a concussion can be serious. If you have had a concussion before, you are more likely to experience concussion-like symptoms after a direct blow to the head.  CAUSES  Direct blow to the head, such as from running into another player during a soccer game, being hit in a fight, or hitting your head on a hard surface.  A jolt of the head or neck that causes the brain to move back and forth inside the skull, such as in a car crash. SIGNS AND SYMPTOMS The signs of a concussion can be hard to notice. Early on, they may be missed by you, family members, and health care providers. You may look fine but act or feel differently. Symptoms are usually temporary, but they may last for days, weeks, or even longer. Some symptoms may appear right away while others may not show up for hours or days. Every head injury is different. Symptoms include:  Mild to moderate headaches that will not go away.  A feeling of pressure inside your head.  Having more trouble than usual:  Learning or remembering things you have heard.  Answering questions.  Paying attention or concentrating.  Organizing daily tasks.  Making decisions and solving problems.  Slowness in thinking, acting or reacting, speaking, or reading.  Getting lost or being easily confused.  Feeling tired all the time or lacking energy (fatigued).  Feeling drowsy.  Sleep disturbances.  Sleeping more than usual.  Sleeping less than usual.  Trouble falling asleep.  Trouble sleeping (insomnia).  Loss of balance or feeling lightheaded or dizzy.  Nausea or vomiting.  Numbness or tingling.  Increased sensitivity to:  Sounds.  Lights.  Distractions.  Vision problems or eyes that tire  easily.  Diminished sense of taste or smell.  Ringing in the ears.  Mood changes such as feeling sad or anxious.  Becoming easily irritated or angry for little or no reason.  Lack of motivation.  Seeing or hearing things other people do not see or hear (hallucinations). DIAGNOSIS Your health care provider can usually diagnose a concussion based on a description of your injury and symptoms. He or she will ask whether you passed out (lost consciousness) and whether you are having trouble remembering events that happened right before and during your injury. Your evaluation might include:  A brain scan to look for signs of injury to the brain. Even if the test shows no injury, you may still have a concussion.  Blood tests to be sure other problems are not present. TREATMENT  Concussions are usually treated in an emergency department, in urgent care, or at a clinic. You may need to stay in the hospital overnight for further treatment.  Tell your health care provider if you are taking any medicines, including prescription medicines, over-the-counter medicines, and natural remedies. Some medicines, such as blood thinners (anticoagulants) and aspirin, may increase the chance of complications. Also tell your health care provider whether you have had alcohol or are taking illegal drugs. This information may affect treatment.  Your health care provider will send you home with important instructions to follow.  How fast you will recover from a concussion depends on many factors. These factors include how severe your concussion is, what part of your brain was injured,  your age, and how healthy you were before the concussion.  Most people with mild injuries recover fully. Recovery can take time. In general, recovery is slower in older persons. Also, persons who have had a concussion in the past or have other medical problems may find that it takes longer to recover from their current injury. HOME  CARE INSTRUCTIONS General Instructions  Carefully follow the directions your health care provider gave you.  Only take over-the-counter or prescription medicines for pain, discomfort, or fever as directed by your health care provider.  Take only those medicines that your health care provider has approved.  Do not drink alcohol until your health care provider says you are well enough to do so. Alcohol and certain other drugs may slow your recovery and can put you at risk of further injury.  If it is harder than usual to remember things, write them down.  If you are easily distracted, try to do one thing at a time. For example, do not try to watch TV while fixing dinner.  Talk with family members or close friends when making important decisions.  Keep all follow-up appointments. Repeated evaluation of your symptoms is recommended for your recovery.  Watch your symptoms and tell others to do the same. Complications sometimes occur after a concussion. Older adults with a brain injury may have a higher risk of serious complications, such as a blood clot on the brain.  Tell your teachers, school nurse, school counselor, coach, athletic trainer, or work Freight forwarder about your injury, symptoms, and restrictions. Tell them about what you can or cannot do. They should watch for:  Increased problems with attention or concentration.  Increased difficulty remembering or learning new information.  Increased time needed to complete tasks or assignments.  Increased irritability or decreased ability to cope with stress.  Increased symptoms.  Rest. Rest helps the brain to heal. Make sure you:  Get plenty of sleep at night. Avoid staying up late at night.  Keep the same bedtime hours on weekends and weekdays.  Rest during the day. Take daytime naps or rest breaks when you feel tired.  Limit activities that require a lot of thought or concentration. These include:  Doing homework or job-related  work.  Watching TV.  Working on the computer.  Avoid any situation where there is potential for another head injury (football, hockey, soccer, basketball, martial arts, downhill snow sports and horseback riding). Your condition will get worse every time you experience a concussion. You should avoid these activities until you are evaluated by the appropriate follow-up health care providers. Returning To Your Regular Activities You will need to return to your normal activities slowly, not all at once. You must give your body and brain enough time for recovery.  Do not return to sports or other athletic activities until your health care provider tells you it is safe to do so.  Ask your health care provider when you can drive, ride a bicycle, or operate heavy machinery. Your ability to react may be slower after a brain injury. Never do these activities if you are dizzy.  Ask your health care provider about when you can return to work or school. Preventing Another Concussion It is very important to avoid another brain injury, especially before you have recovered. In rare cases, another injury can lead to permanent brain damage, brain swelling, or death. The risk of this is greatest during the first 7-10 days after a head injury. Avoid injuries by:  Wearing a  seat belt when riding in a car.  Drinking alcohol only in moderation.  Wearing a helmet when biking, skiing, skateboarding, skating, or doing similar activities.  Avoiding activities that could lead to a second concussion, such as contact or recreational sports, until your health care provider says it is okay.  Taking safety measures in your home.  Remove clutter and tripping hazards from floors and stairways.  Use grab bars in bathrooms and handrails by stairs.  Place non-slip mats on floors and in bathtubs.  Improve lighting in dim areas. SEEK MEDICAL CARE IF:  You have increased problems paying attention or  concentrating.  You have increased difficulty remembering or learning new information.  You need more time to complete tasks or assignments than before.  You have increased irritability or decreased ability to cope with stress.  You have more symptoms than before. Seek medical care if you have any of the following symptoms for more than 2 weeks after your injury:  Lasting (chronic) headaches.  Dizziness or balance problems.  Nausea.  Vision problems.  Increased sensitivity to noise or light.  Depression or mood swings.  Anxiety or irritability.  Memory problems.  Difficulty concentrating or paying attention.  Sleep problems.  Feeling tired all the time. SEEK IMMEDIATE MEDICAL CARE IF:  You have severe or worsening headaches. These may be a sign of a blood clot in the brain.  You have weakness (even if only in one hand, leg, or part of the face).  You have numbness.  You have decreased coordination.  You vomit repeatedly.  You have increased sleepiness.  One pupil is larger than the other.  You have convulsions.  You have slurred speech.  You have increased confusion. This may be a sign of a blood clot in the brain.  You have increased restlessness, agitation, or irritability.  You are unable to recognize people or places.  You have neck pain.  It is difficult to wake you up.  You have unusual behavior changes.  You lose consciousness. MAKE SURE YOU:  Understand these instructions.  Will watch your condition.  Will get help right away if you are not doing well or get worse.   This information is not intended to replace advice given to you by your health care provider. Make sure you discuss any questions you have with your health care provider.   Document Released: 09/01/2003 Document Revised: 07/02/2014 Document Reviewed: 01/01/2013 [employer] Interactive Patient Education 2016 [employer]  Facial Laceration  A facial laceration is a cut  on the face. These injuries can be painful and cause bleeding. Lacerations usually heal quickly, but they need special care to reduce scarring. DIAGNOSIS  Your health care provider will take a medical history, ask for details about how the injury occurred, and examine the wound to determine how deep the cut is. TREATMENT  Some facial lacerations may not require closure. Others may not be able to be closed because of an increased risk of infection. The risk of infection and the chance for successful closure will depend on various factors, including the amount of time since the injury occurred. The wound may be cleaned to help prevent infection. If closure is appropriate, pain medicines may be given if needed. Your health care provider will use stitches (sutures), wound glue (adhesive), or skin adhesive strips to repair the laceration. These tools bring the skin edges together to allow for faster healing and a better cosmetic outcome. If needed, you may also be given a tetanus  shot. HOME CARE INSTRUCTIONS  Only take over-the-counter or prescription medicines as directed by your health care provider.  Follow your health care provider's instructions for wound care. These instructions will vary depending on the technique used for closing the wound. For Sutures:  Keep the wound clean and dry.   If you were given a bandage (dressing), you should change it at least once a day. Also change the dressing if it becomes wet or dirty, or as directed by your health care provider.   Wash the wound with soap and water 2 times a day. Rinse the wound off with water to remove all soap. Pat the wound dry with a clean towel.   After cleaning, apply a thin layer of the antibiotic ointment recommended by your health care provider. This will help prevent infection and keep the dressing from sticking.   You may shower as usual after the first 24 hours. Do not soak the wound in water until the sutures are removed.    Get your sutures removed as directed by your health care provider. With facial lacerations, sutures should usually be taken out after 4-5 days to avoid stitch marks.   Wait a few days after your sutures are removed before applying any makeup. For Skin Adhesive Strips:  Keep the wound clean and dry.   Do not get the skin adhesive strips wet. You may bathe carefully, using caution to keep the wound dry.   If the wound gets wet, pat it dry with a clean towel.   Skin adhesive strips will fall off on their own. You may trim the strips as the wound heals. Do not remove skin adhesive strips that are still stuck to the wound. They will fall off in time.  For Wound Adhesive:  You may briefly wet your wound in the shower or bath. Do not soak or scrub the wound. Do not swim. Avoid periods of heavy sweating until the skin adhesive has fallen off on its own. After showering or bathing, gently pat the wound dry with a clean towel.   Do not apply liquid medicine, cream medicine, ointment medicine, or makeup to your wound while the skin adhesive is in place. This may loosen the film before your wound is healed.   If a dressing is placed over the wound, be careful not to apply tape directly over the skin adhesive. This may cause the adhesive to be pulled off before the wound is healed.   Avoid prolonged exposure to sunlight or tanning lamps while the skin adhesive is in place.  The skin adhesive will usually remain in place for 5-10 days, then naturally fall off the skin. Do not pick at the adhesive film.  After Healing: Once the wound has healed, cover the wound with sunscreen during the day for 1 full year. This can help minimize scarring. Exposure to ultraviolet light in the first year will darken the scar. It can take 1-2 years for the scar to lose its redness and to heal completely.  SEEK MEDICAL CARE IF:  You have a fever. SEEK IMMEDIATE MEDICAL CARE IF:  You have redness, pain, or  swelling around the wound.   You see ayellowish-white fluid (pus) coming from the wound.    This information is not intended to replace advice given to you by your health care provider. Make sure you discuss any questions you have with your health care provider.   Document Released: 07/19/2004 Document Revised: 07/02/2014 Document Reviewed: 01/22/2013 [employer] Interactive Patient  Education ©2016 [employer] ° °

## 2015-04-22 NOTE — ED Notes (Signed)
[address] going up stairs, hitting head on side of brick steps.  Abrasion to forehead, rates pain 7/10.  C/o pain to side of left neck, rates pain 7/10.  Pt says she can not remember if she passed out or not.

## 2015-04-22 NOTE — ED Notes (Signed)
Pt placed in [facility] .

## 2015-04-22 NOTE — ED Provider Notes (Addendum)
CSN: [id number]     Arrival date & time 04/22/15  1836 History   First MD Initiated Contact with Patient 04/22/15 1850     Chief Complaint  Patient presents with  . Head Laceration  . Neck Injury  . Facial Injury    left      HPI  Anne Hamilton presents for evaluation after a fall.  Anne Hamilton has a history of recurrent "low-grade thyroid cancer". Has some tumor mass in her left neck. Scheduled for excision in a few weeks. Anne Hamilton's had some episodes of orthostasis and lightheadedness and neck show near syncope for the last few weeks. Tonight Anne Hamilton was walking from her house out to her garage and back to get a soda. Anne Hamilton does not know Anne Hamilton fell and struck her head or Anne Hamilton had syncope. However Anne Hamilton finds her self on the ground with some bleeding from her forehead. Has a headache. Has some pain in her posterior neck. Has a laceration on her forehead. Past history is otherwise reassuring no history other cardiovascular disease not hypertensive diabetic. No history of seizures.  Past Medical History  Diagnosis Date  . Thyroid disease   . Pelvic pain in female   . Ovarian cyst, right   . Irregular periods   . Thyroid cancer [facility])    Past Surgical History  Procedure Laterality Date  . Tonsillectomy    . Tubes in ears.    . Thyroid surgery     Family History  Problem Relation Age of Onset  . Adopted: Yes   Social History  Substance Use Topics  . Smoking status: Former Research scientist (life sciences)  . Smokeless tobacco: Never Used     Comment: patient states Anne Hamilton quit 43yrs ago.  . Alcohol Use: No     Comment: rarely   OB History    Gravida Para Term Preterm AB TAB SAB Ectopic Multiple Living   0 0 0 0 0 0 0 0 0 0      Review of Systems  Constitutional: Negative for fever, chills, diaphoresis, appetite change and fatigue.  HENT: Negative for mouth sores, sore throat and trouble swallowing.   Eyes: Negative for visual disturbance.  Respiratory: Negative for cough, chest tightness, shortness of breath and wheezing.    Cardiovascular: Negative for chest pain.  Gastrointestinal: Negative for nausea, vomiting, abdominal pain, diarrhea and abdominal distention.  Endocrine: Negative for polydipsia, polyphagia and polyuria.  Genitourinary: Negative for dysuria, frequency and hematuria.  Musculoskeletal: Positive for neck pain. Negative for gait problem.  Skin: Positive for wound. Negative for color change, pallor and rash.  Neurological: Positive for syncope and headaches. Negative for dizziness and light-headedness.  Hematological: Does not bruise/bleed easily.  Psychiatric/Behavioral: Negative for behavioral problems and confusion.      Allergies  Diclofenac and Oxycodone-acetaminophen  Home Medications   Prior to Admission medications   Medication Sig Start Date End Date Taking? Authorizing Provider  ibuprofen (ADVIL,MOTRIN) 200 MG tablet Take 800-1,000 mg by mouth every 6 (six) hours as needed for mild pain or moderate pain.    Yes Historical Provider, MD  ondansetron (ZOFRAN) 4 MG tablet Take 1 tablet (4 mg total) by mouth every 8 (eight) hours as needed for nausea or vomiting. 04/06/15  Yes Lisa Roca, MD  promethazine (PHENERGAN) 25 MG tablet Take 1 tablet (25 mg total) by mouth every 6 (six) hours as needed for nausea or vomiting. 04/21/15  Yes Forde Dandy, MD  sucralfate (CARAFATE) 1 GM/10ML suspension Take 10 mLs (1 g total) by mouth  4 (four) times daily -  with meals and at bedtime. 04/06/15 04/05/16 Yes Lisa Roca, MD  HYDROcodone-acetaminophen (NORCO/VICODIN) 5-325 MG tablet Take 2 tablets by mouth every 4 (four) hours as needed. 04/22/15   Tanna Furry, MD  levothyroxine (SYNTHROID) 100 MCG tablet Take 1 tablet (100 mcg total) by mouth daily. Patient not taking: Reported on 04/21/2015 02/25/15 02/25/16  Delman Kitten, MD  ondansetron (ZOFRAN ODT) 4 MG disintegrating tablet Take 1 tablet (4 mg total) by mouth every 8 (eight) hours as needed for nausea. 04/22/15   Tanna Furry, MD   BP 139/98 mmHg   Pulse 92  Temp(Src) 98.6 F (37 C) (Oral)  Resp 16  Ht 5\' 6"  (1.676 m)  Wt 130 lb (58.968 kg)  BMI 20.99 kg/m2  SpO2 92%  LMP 04/05/2015 Physical Exam  Constitutional: Anne Hamilton is oriented to person, place, and time. Anne Hamilton appears well-developed and well-nourished. No distress.  HENT:  Head: Normocephalic.    Vertically oriented 3 cm laceration into the hairline in the midline forehead. No blood over the TMs, mastoids, or from ears nose or mouth.  Eyes: Conjunctivae are normal. Pupils are equal, round, and reactive to light. No scleral icterus.  Neck: Normal range of motion. Neck supple. No thyromegaly present.    Tenderness at the left lateral edge of a prominent thyroid mass. Tenderness to left lateral neck posteriorly.  Cardiovascular: Normal rate and regular rhythm.  Exam reveals no gallop and no friction rub.   No murmur heard. Pulmonary/Chest: Effort normal and breath sounds normal. No respiratory distress. Anne Hamilton has no wheezes. Anne Hamilton has no rales.  Abdominal: Soft. Bowel sounds are normal. Anne Hamilton exhibits no distension. There is no tenderness. There is no rebound.  Musculoskeletal: Normal range of motion.  Neurological: Anne Hamilton is alert and oriented to person, place, and time.  Skin: Skin is warm and dry. No rash noted.  Psychiatric: Anne Hamilton has a normal mood and affect. Her behavior is normal.    ED Course  Procedures (including critical care time) Labs Review Labs Reviewed  CBC WITH DIFFERENTIAL/PLATELET - Abnormal; Notable for the following:    HCT 34.8 (*)    Neutro Abs 1.6 (*)    All other components within normal limits  BASIC METABOLIC PANEL - Abnormal; Notable for the following:    Potassium 3.4 (*)    All other components within normal limits    Imaging Review Dg Chest 2 View  04/21/2015  CLINICAL DATA:  Left-sided chest pain, congestion and vertigo. EXAM: CHEST  2 VIEW COMPARISON:  None. FINDINGS: Cardiomediastinal silhouette is normal. Mediastinal contours appear intact.  There is no evidence of focal airspace consolidation, pleural effusion or pneumothorax. Osseous structures are without acute abnormality. Soft tissues are grossly normal. IMPRESSION: No active cardiopulmonary disease. Electronically Signed   By: Fidela Salisbury M.D.   On: 04/21/2015 14:46   Ct Head Wo Contrast  04/22/2015  CLINICAL DATA:  Fall. EXAM: CT HEAD WITHOUT CONTRAST CT MAXILLOFACIAL WITHOUT CONTRAST CT CERVICAL SPINE WITHOUT CONTRAST TECHNIQUE: Multidetector CT imaging of the head, cervical spine, and maxillofacial structures were performed using the standard protocol without intravenous contrast. Multiplanar CT image reconstructions of the cervical spine and maxillofacial structures were also generated. COMPARISON:  None. FINDINGS: CT HEAD FINDINGS No acute cortical infarct, hemorrhage, or mass lesion ispresent. Ventricles are of normal size. No significant extra-axial fluid collection is present. The paranasal sinuses andmastoid air cells are clear. The osseous skull is intact. CT MAXILLOFACIAL FINDINGS The paranasal sinuses appear clear. The  mastoid air cells are also clear. The orbits are intact. The nasal bone is intact. The nasal septum is midline. The mandible appears located. The sinuses are intact. CT CERVICAL SPINE FINDINGS Normal alignment of the cervical spine. The vertebral body heights and the disc spaces are well preserved. The facet joints are well-aligned. The prevertebral soft tissue space is unremarkable. No fracture or subluxation identified. There is a large heterogeneous nodule identified within the left lobe of thyroid gland measuring 2.4 cm. IMPRESSION: 1. No acute intracranial abnormality. 2. No evidence for facial bone fracture. 3. Cervical spine appears intact.  No fracture. Electronically Signed   By: Kerby Moors M.D.   On: 04/22/2015 21:04   Ct Cervical Spine Wo Contrast  04/22/2015  CLINICAL DATA:  Fall. EXAM: CT HEAD WITHOUT CONTRAST CT MAXILLOFACIAL WITHOUT  CONTRAST CT CERVICAL SPINE WITHOUT CONTRAST TECHNIQUE: Multidetector CT imaging of the head, cervical spine, and maxillofacial structures were performed using the standard protocol without intravenous contrast. Multiplanar CT image reconstructions of the cervical spine and maxillofacial structures were also generated. COMPARISON:  None. FINDINGS: CT HEAD FINDINGS No acute cortical infarct, hemorrhage, or mass lesion ispresent. Ventricles are of normal size. No significant extra-axial fluid collection is present. The paranasal sinuses andmastoid air cells are clear. The osseous skull is intact. CT MAXILLOFACIAL FINDINGS The paranasal sinuses appear clear. The mastoid air cells are also clear. The orbits are intact. The nasal bone is intact. The nasal septum is midline. The mandible appears located. The sinuses are intact. CT CERVICAL SPINE FINDINGS Normal alignment of the cervical spine. The vertebral body heights and the disc spaces are well preserved. The facet joints are well-aligned. The prevertebral soft tissue space is unremarkable. No fracture or subluxation identified. There is a large heterogeneous nodule identified within the left lobe of thyroid gland measuring 2.4 cm. IMPRESSION: 1. No acute intracranial abnormality. 2. No evidence for facial bone fracture. 3. Cervical spine appears intact.  No fracture. Electronically Signed   By: Kerby Moors M.D.   On: 04/22/2015 21:04   Ct Maxillofacial Wo Cm  04/22/2015  CLINICAL DATA:  Fall. EXAM: CT HEAD WITHOUT CONTRAST CT MAXILLOFACIAL WITHOUT CONTRAST CT CERVICAL SPINE WITHOUT CONTRAST TECHNIQUE: Multidetector CT imaging of the head, cervical spine, and maxillofacial structures were performed using the standard protocol without intravenous contrast. Multiplanar CT image reconstructions of the cervical spine and maxillofacial structures were also generated. COMPARISON:  None. FINDINGS: CT HEAD FINDINGS No acute cortical infarct, hemorrhage, or mass lesion  ispresent. Ventricles are of normal size. No significant extra-axial fluid collection is present. The paranasal sinuses andmastoid air cells are clear. The osseous skull is intact. CT MAXILLOFACIAL FINDINGS The paranasal sinuses appear clear. The mastoid air cells are also clear. The orbits are intact. The nasal bone is intact. The nasal septum is midline. The mandible appears located. The sinuses are intact. CT CERVICAL SPINE FINDINGS Normal alignment of the cervical spine. The vertebral body heights and the disc spaces are well preserved. The facet joints are well-aligned. The prevertebral soft tissue space is unremarkable. No fracture or subluxation identified. There is a large heterogeneous nodule identified within the left lobe of thyroid gland measuring 2.4 cm. IMPRESSION: 1. No acute intracranial abnormality. 2. No evidence for facial bone fracture. 3. Cervical spine appears intact.  No fracture. Electronically Signed   By: Kerby Moors M.D.   On: 04/22/2015 21:04   I have personally reviewed and evaluated these images and lab results as part of my medical decision-making.  EKG Interpretation None      MDM   Final diagnoses:  Forehead laceration, initial encounter  Concussion, with loss of consciousness of unspecified duration, initial encounter    Normal studies. Normal labs. Laceration repaired with Dermabond. Her symptoms could be related to carotid sinus syndrome because of her left neck tumor bulk. Anne Hamilton is here in stable here. Think Anne Hamilton is appropriate for outpatient treatment with orthostatic precautions.    Tanna Furry, MD 04/22/15 [place], MD 04/22/15 [phone]

## 2015-05-13 ENCOUNTER — Emergency Department
Admission: EM | Admit: 2015-05-13 | Discharge: 2015-05-13 | Disposition: A | Discharge status: Home / Self Care | Payer: Self-pay | Attending: Emergency Medicine | Admitting: Emergency Medicine

## 2015-05-13 DIAGNOSIS — K0889 Other specified disorders of teeth and supporting structures: Secondary | ICD-10-CM | POA: Insufficient documentation

## 2015-05-13 DIAGNOSIS — Z87891 Personal history of nicotine dependence: Secondary | ICD-10-CM | POA: Insufficient documentation

## 2015-05-13 DIAGNOSIS — Z3202 Encounter for pregnancy test, result negative: Secondary | ICD-10-CM | POA: Insufficient documentation

## 2015-05-13 DIAGNOSIS — T189XXA Foreign body of alimentary tract, part unspecified, initial encounter: Secondary | ICD-10-CM | POA: Insufficient documentation

## 2015-05-13 DIAGNOSIS — Y9389 Activity, other specified: Secondary | ICD-10-CM | POA: Insufficient documentation

## 2015-05-13 DIAGNOSIS — Y9289 Other specified places as the place of occurrence of the external cause: Secondary | ICD-10-CM | POA: Insufficient documentation

## 2015-05-13 DIAGNOSIS — Y998 Other external cause status: Secondary | ICD-10-CM | POA: Insufficient documentation

## 2015-05-13 DIAGNOSIS — R112 Nausea with vomiting, unspecified: Secondary | ICD-10-CM | POA: Insufficient documentation

## 2015-05-13 DIAGNOSIS — R42 Dizziness and giddiness: Secondary | ICD-10-CM | POA: Insufficient documentation

## 2015-05-13 DIAGNOSIS — K0501 Acute gingivitis, non-plaque induced: Secondary | ICD-10-CM | POA: Insufficient documentation

## 2015-05-13 DIAGNOSIS — X58XXXA Exposure to other specified factors, initial encounter: Secondary | ICD-10-CM | POA: Insufficient documentation

## 2015-05-13 DIAGNOSIS — T6591XA Toxic effect of unspecified substance, accidental (unintentional), initial encounter: Secondary | ICD-10-CM

## 2015-05-13 LAB — COMPREHENSIVE METABOLIC PANEL
ALBUMIN: 5 g/dL (ref 3.5–5.0)
ALT: 12 U/L — AB (ref 14–54)
AST: 18 U/L (ref 15–41)
Alkaline Phosphatase: 56 U/L (ref 38–126)
Anion gap: 8 (ref 5–15)
BILIRUBIN TOTAL: 1.3 mg/dL — AB (ref 0.3–1.2)
BUN: 11 mg/dL (ref 6–20)
CHLORIDE: 102 mmol/L (ref 101–111)
CO2: 27 mmol/L (ref 22–32)
CREATININE: 0.75 mg/dL (ref 0.44–1.00)
Calcium: 9.6 mg/dL (ref 8.9–10.3)
GFR calc Af Amer: >60 mL/min (ref >60)
GFR calc non Af Amer: >60 mL/min (ref >60)
GLUCOSE: 106 mg/dL — AB (ref 65–99)
POTASSIUM: 3.5 mmol/L (ref 3.5–5.1)
Sodium: 137 mmol/L (ref 135–145)
Total Protein: 8.4 g/dL — ABNORMAL HIGH (ref 6.5–8.1)

## 2015-05-13 LAB — POCT PREGNANCY, URINE: PREG TEST UR: NEGATIVE

## 2015-05-13 LAB — URINALYSIS COMPLETE WITH MICROSCOPIC (ARMC ONLY)
BACTERIA UA: NONE SEEN
Bilirubin Urine: NEGATIVE
GLUCOSE, UA: NEGATIVE mg/dL
Hgb urine dipstick: NEGATIVE
Leukocytes, UA: NEGATIVE
Nitrite: NEGATIVE
Protein, ur: NEGATIVE mg/dL
SPECIFIC GRAVITY, URINE: 1.019 (ref 1.005–1.030)
pH: 5 (ref 5.0–8.0)

## 2015-05-13 LAB — CBC
HEMATOCRIT: 39.5 % (ref 35.0–47.0)
Hemoglobin: 13.5 g/dL (ref 12.0–16.0)
MCH: 29.6 pg (ref 26.0–34.0)
MCHC: 34.3 g/dL (ref 32.0–36.0)
MCV: 86.2 fL (ref 80.0–100.0)
PLATELETS: 196 10*3/uL (ref 150–440)
RBC: 4.58 MIL/uL (ref 3.80–5.20)
RDW: 11.4 % — AB (ref 11.5–14.5)
WBC: 13 10*3/uL — ABNORMAL HIGH (ref 3.6–11.0)

## 2015-05-13 MED ORDER — ONDANSETRON HCL 4 MG PO TABS: 4.0000 mg | ORAL_TABLET | Freq: Every day | ORAL | Status: AC | PRN

## 2015-05-13 MED ORDER — FIRST-DUKES MOUTHWASH MT SUSP: 5.0000 mL | Freq: Three times a day (TID) | OROMUCOSAL | Status: AC | PRN

## 2015-05-13 MED ORDER — ONDANSETRON 4 MG PO TBDP
ORAL_TABLET | ORAL | Status: DC
Start: 2015-05-13 — End: 2015-05-14
  Filled 2015-05-13: qty 1

## 2015-05-13 MED ORDER — ONDANSETRON 4 MG PO TBDP
4.0000 mg | ORAL_TABLET | Freq: Once | ORAL | Status: AC | PRN
  Administered 2015-05-13: 4 mg via ORAL

## 2015-05-13 NOTE — ED Provider Notes (Signed)
[facility] Emergency Department Provider Note     Time seen: ----------------------------------------- 9:47 PM on 05/13/2015 -----------------------------------------    I have reviewed the triage vital signs and the nursing notes.   HISTORY  Chief Complaint Emesis    HPI Anne Hamilton is a 29 y.o. female who presents ER after she took some lavender and to treat all for lower mid jaw toothache around 245 this morning. Patient has history of thyroid cancer and is scheduled for surgery this week. She's been vomiting since applying the wall. Denies fever or diarrhea.Does complain of mouth pain.   Past Medical History  Diagnosis Date  . Thyroid disease   . Pelvic pain in female   . Ovarian cyst, right   . Irregular periods   . Thyroid cancer [facility])     Patient Active Problem List   Diagnosis Date Noted  . Thyroid goiter 02/18/2015  . Anterior neck pain 02/18/2015  . Irregular menses 02/18/2015    Past Surgical History  Procedure Laterality Date  . Tonsillectomy    . Tubes in ears.    . Thyroid surgery      Allergies Diclofenac and Oxycodone-acetaminophen  Social History Social History  Substance Use Topics  . Smoking status: Former Research scientist (life sciences)  . Smokeless tobacco: Never Used     Comment: patient states she quit 51yrs ago.  . Alcohol Use: No     Comment: rarely    Review of Systems Constitutional: Negative for fever. Eyes: Negative for visual changes. ENT: Positive for lip and gum soreness, toothache Cardiovascular: Negative for chest pain. Respiratory: Negative for shortness of breath. Gastrointestinal: Negative for abdominal pain, positive for vomiting Genitourinary: Negative for dysuria. Musculoskeletal: Negative for back pain. Skin: Negative for rash. Neurological: Negative for headaches, focal weakness or numbness.  10-point ROS otherwise negative.  ____________________________________________   PHYSICAL EXAM:  VITAL  SIGNS: ED Triage Vitals  Enc Vitals Group     BP 05/13/15 2102 134/98 mmHg     Pulse Rate 05/13/15 2102 88     Resp 05/13/15 2102 16     Temp 05/13/15 2102 98.2 F (36.8 C)     Temp Source 05/13/15 2102 Oral     SpO2 05/13/15 2102 100 %     Weight 05/13/15 2102 128 lb (58.06 kg)     Height 05/13/15 2102 5\' 5"  (1.651 m)     Head Cir --      Peak Flow --      Pain Score 05/13/15 2103 8     Pain Loc --      Pain Edu? --      Excl. in [place]? --     Constitutional: Alert and oriented. Well appearing and in no distress. Eyes: Conjunctivae are normal. PERRL. Normal extraocular movements. ENT   Head: Normocephalic and atraumatic.   Nose: No congestion/rhinnorhea.   Mouth/Throat: Mucous membranes are moist. Widespread tooth decay with dental caries.   Neck: No stridor. Cardiovascular: Normal rate, regular rhythm. Normal and symmetric distal pulses are present in all extremities. No murmurs, rubs, or gallops. Respiratory: Normal respiratory effort without tachypnea nor retractions. Breath sounds are clear and equal bilaterally. No wheezes/rales/rhonchi. Gastrointestinal: Soft and nontender. No distention. No abdominal bruits.  Musculoskeletal: Nontender with normal range of motion in all extremities. No joint effusions.  No lower extremity tenderness nor edema. Neurologic:  Normal speech and language. No gross focal neurologic deficits are appreciated. Speech is normal. No gait instability. Skin:  Skin is warm, dry and  intact. No rash noted. ___________________________________________  ED COURSE:  Pertinent labs & imaging results that were available during my care of the patient were reviewed by me and considered in my medical decision making (see chart for details). Nonspecific symptoms, check basic labs and reevaluate. ____________________________________________    LABS (pertinent positives/negatives)  Labs Reviewed  COMPREHENSIVE METABOLIC PANEL - Abnormal; Notable for  the following:    Glucose, Bld 106 (*)    Total Protein 8.4 (*)    ALT 12 (*)    Total Bilirubin 1.3 (*)    All other components within normal limits  CBC - Abnormal; Notable for the following:    WBC 13.0 (*)    RDW 11.4 (*)    All other components within normal limits  URINALYSIS COMPLETEWITH MICROSCOPIC ([facility] ONLY) - Abnormal; Notable for the following:    Color, Urine YELLOW (*)    APPearance CLEAR (*)    Ketones, ur 1+ (*)    Squamous Epithelial / LPF 0-5 (*)    All other components within normal limits  POC URINE PREG, ED  POCT PREGNANCY, URINE   ____________________________________________  FINAL ASSESSMENT AND PLAN  Vomiting, toothache, gingivitis  Plan: Patient with labs and imaging as dictated above. Patient had ingested or taken a small amount to treat oral intraorally using a Q-tip. She is having some burning in the buccal mucosa has resolved. The rest her symptoms I doubt are secondary to this. She was given Zofran for nausea and is able to keep things down here. She is stable for discharge.   Earleen Newport, MD   Earleen Newport, MD 05/13/15 [phone]

## 2015-05-13 NOTE — ED Notes (Signed)
Pt states she used lavender and tea tree oil straight on her gums for a tooth ache. Pt states her mouth is burning and its going down her throat.

## 2015-05-13 NOTE — Discharge Instructions (Signed)
Nontoxic Ingestion Nontoxic ingestion means that the substance you have swallowed (ingested) is not likely to cause serious medical problems. Further treatment is not needed at this time. However, the effects of drugs or other substances can sometimes be delayed, so you should watch your condition for any changes. HOME CARE INSTRUCTIONS  Take medicines only as directed by your health care provider.  Follow instructions from your health care provider about eating or drinking restrictions. If you have vomited since ingesting the substance, you may need to avoid eating or drinking for a few hours. After that, you can start with small sips of clear liquids until your stomach settles.  Do not drink alcohol or use illegal drugs.  Keep all follow-up visits as directed by your health care provider. This is important. SEEK MEDICAL CARE IF:  You have a fever.  You have vomiting. SEEK IMMEDIATE MEDICAL CARE IF:  You have difficulty walking.  You have confusion or agitation.  You are overly tired.  You have difficulty breathing.  You have difficulty swallowing or you have a lot of mucus.  You have a seizure.  You have profuse sweating.  You have a cough.  You have abdominal pain, repeated vomiting, or severe diarrhea.  You have weakness.  You have a fast or irregular heartbeat (palpitations).  You have signs of dehydration, such as:  Severe thirst.  Dry lips and mouth.  Dizziness.  Dark urine or a decreasing need to urinate.  Rapid breathing or rapid pulse.   This information is not intended to replace advice given to you by your health care provider. Make sure you discuss any questions you have with your health care provider.   Document Released: 07/19/2004 Document Revised: 10/26/2014 Document Reviewed: 05/05/2014 Elsevier Interactive Patient Education 2016 Elsevier [employer] Nausea and Vomiting Nausea is a sick feeling that often comes before throwing up (vomiting).  Vomiting is a reflex where stomach contents come out of your mouth. Vomiting can cause severe loss of body fluids (dehydration). Children and elderly adults can become dehydrated quickly, especially if they also have diarrhea. Nausea and vomiting are symptoms of a condition or disease. It is important to find the cause of your symptoms. CAUSES   Direct irritation of the stomach lining. This irritation can result from increased acid production (gastroesophageal reflux disease), infection, food poisoning, taking certain medicines (such as nonsteroidal anti-inflammatory drugs), alcohol use, or tobacco use.  Signals from the brain.These signals could be caused by a headache, heat exposure, an inner ear disturbance, increased pressure in the brain from injury, infection, a tumor, or a concussion, pain, emotional stimulus, or metabolic problems.  An obstruction in the gastrointestinal tract (bowel obstruction).  Illnesses such as diabetes, hepatitis, gallbladder problems, appendicitis, kidney problems, cancer, sepsis, atypical symptoms of a heart attack, or eating disorders.  Medical treatments such as chemotherapy and radiation.  Receiving medicine that makes you sleep (general anesthetic) during surgery. DIAGNOSIS Your caregiver may ask for tests to be done if the problems do not improve after a few days. Tests may also be done if symptoms are severe or if the reason for the nausea and vomiting is not clear. Tests may include:  Urine tests.  Blood tests.  Stool tests.  Cultures (to look for evidence of infection).  X-rays or other imaging studies. Test results can help your caregiver make decisions about treatment or the need for additional tests. TREATMENT You need to stay well hydrated. Drink frequently but in small amounts.You may wish to drink  water, sports drinks, clear broth, or eat frozen ice pops or gelatin dessert to help stay hydrated.When you eat, eating slowly may help prevent  nausea.There are also some antinausea medicines that may help prevent nausea. HOME CARE INSTRUCTIONS   Take all medicine as directed by your caregiver.  If you do not have an appetite, do not force yourself to eat. However, you must continue to drink fluids.  If you have an appetite, eat a normal diet unless your caregiver tells you differently.  Eat a variety of complex carbohydrates (rice, wheat, potatoes, bread), lean meats, yogurt, fruits, and vegetables.  Avoid high-fat foods because they are more difficult to digest.  Drink enough water and fluids to keep your urine clear or pale yellow.  If you are dehydrated, ask your caregiver for specific rehydration instructions. Signs of dehydration may include:  Severe thirst.  Dry lips and mouth.  Dizziness.  Dark urine.  Decreasing urine frequency and amount.  Confusion.  Rapid breathing or pulse. SEEK IMMEDIATE MEDICAL CARE IF:   You have blood or brown flecks (like coffee grounds) in your vomit.  You have black or bloody stools.  You have a severe headache or stiff neck.  You are confused.  You have severe abdominal pain.  You have chest pain or trouble breathing.  You do not urinate at least once every 8 hours.  You develop cold or clammy skin.  You continue to vomit for longer than 24 to 48 hours.  You have a fever. MAKE SURE YOU:   Understand these instructions.  Will watch your condition.  Will get help right away if you are not doing well or get worse.   This information is not intended to replace advice given to you by your health care provider. Make sure you discuss any questions you have with your health care provider.   Document Released: 06/11/2005 Document Revised: 09/03/2011 Document Reviewed: 11/08/2010 Elsevier Interactive Patient Education [employer].

## 2015-05-13 NOTE — ED Notes (Signed)
Pt states took some lavender and tea tree oil for a lower mid jaw toothache at 0245 this am. Pt with history of thyroid cancer, without radiation or chemo. Pt states has been vomiting since applying oil this am. Pt denies fever or diarrhea.

## 2016-03-09 IMAGING — CR DG CHEST 2V
2 series · 2 of 2 positions shown · non-contrast
Comparison: 02/25/2015 CT scan

CLINICAL DATA: Patient states she feels like her neck is being
squeezed and feels as if something is stuck in her throat since this
morning which has worsened. Patient also states that she is dizzy,
has headache, hurts to turn her neck, her left shoulder is numb, she
has felt off balance today and has episodes of forgetfulness today

EXAM:
CHEST  2 VIEW

[chest pa]
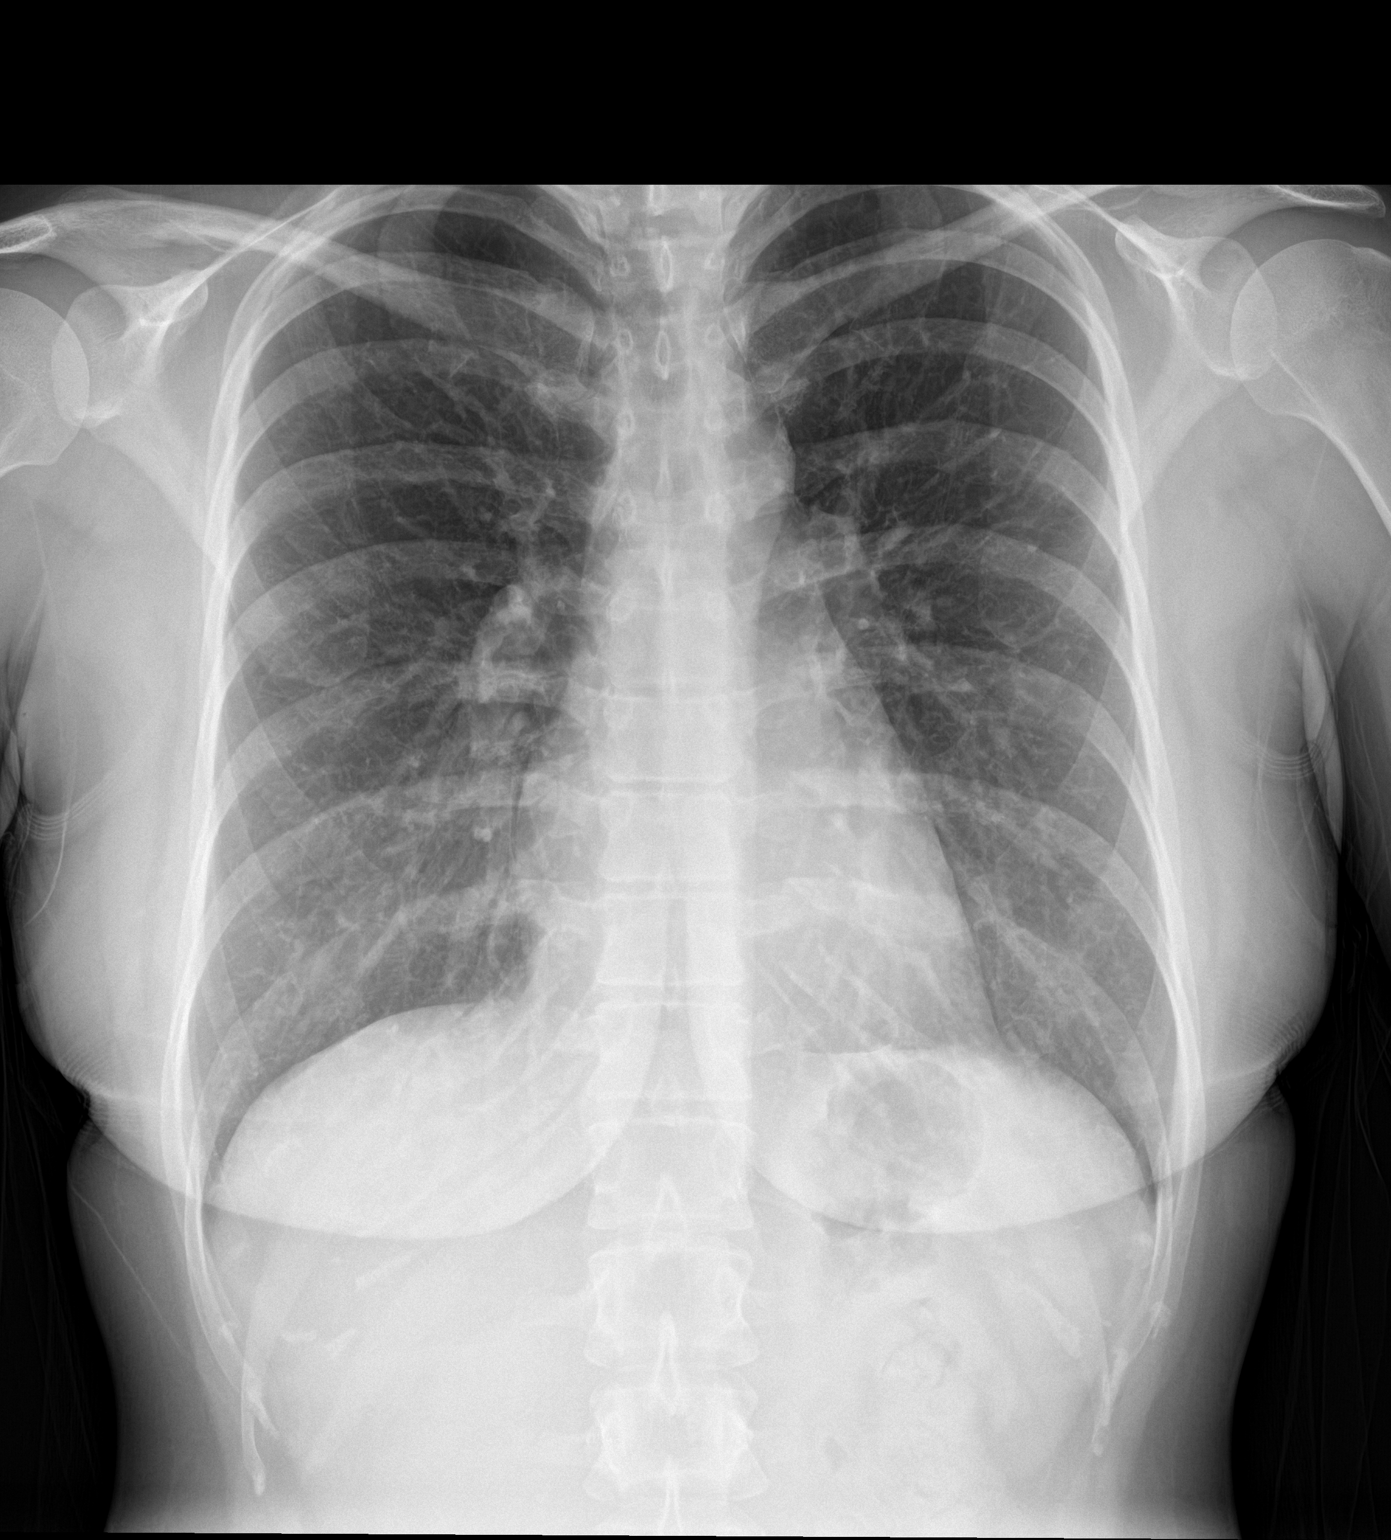

[chest lat]
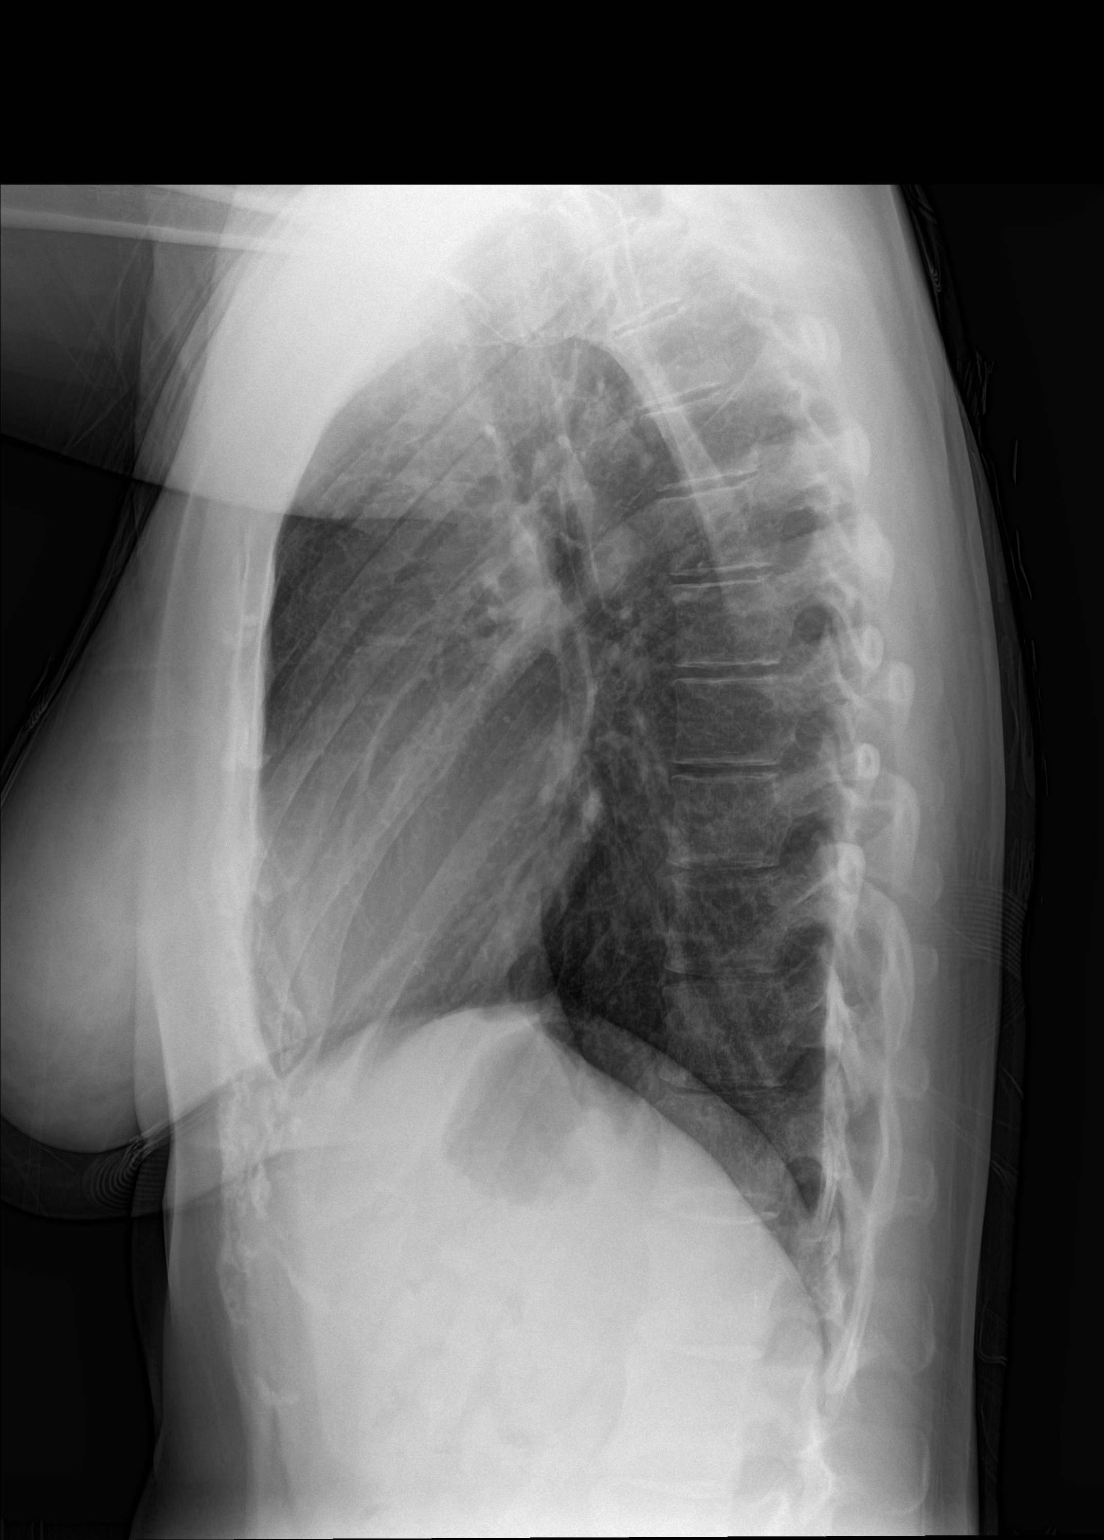

[2 of 2 positions shown; findings below may reference images not displayed]

FINDINGS: Heart size and vascular pattern are normal. Lungs are clear. Trachea
is deviated mildly toward the right. This is related to the
patient's known left thyroid mass.
IMPRESSION: Known left thyroid mass.  No acute cardiopulmonary process.

## 2016-04-22 IMAGING — DX DG CHEST 2V
2 series · 2 of 2 positions shown · non-contrast
Comparison: None.

CLINICAL DATA: Left-sided chest pain, congestion and vertigo.

EXAM:
CHEST  2 VIEW

[chest pa]
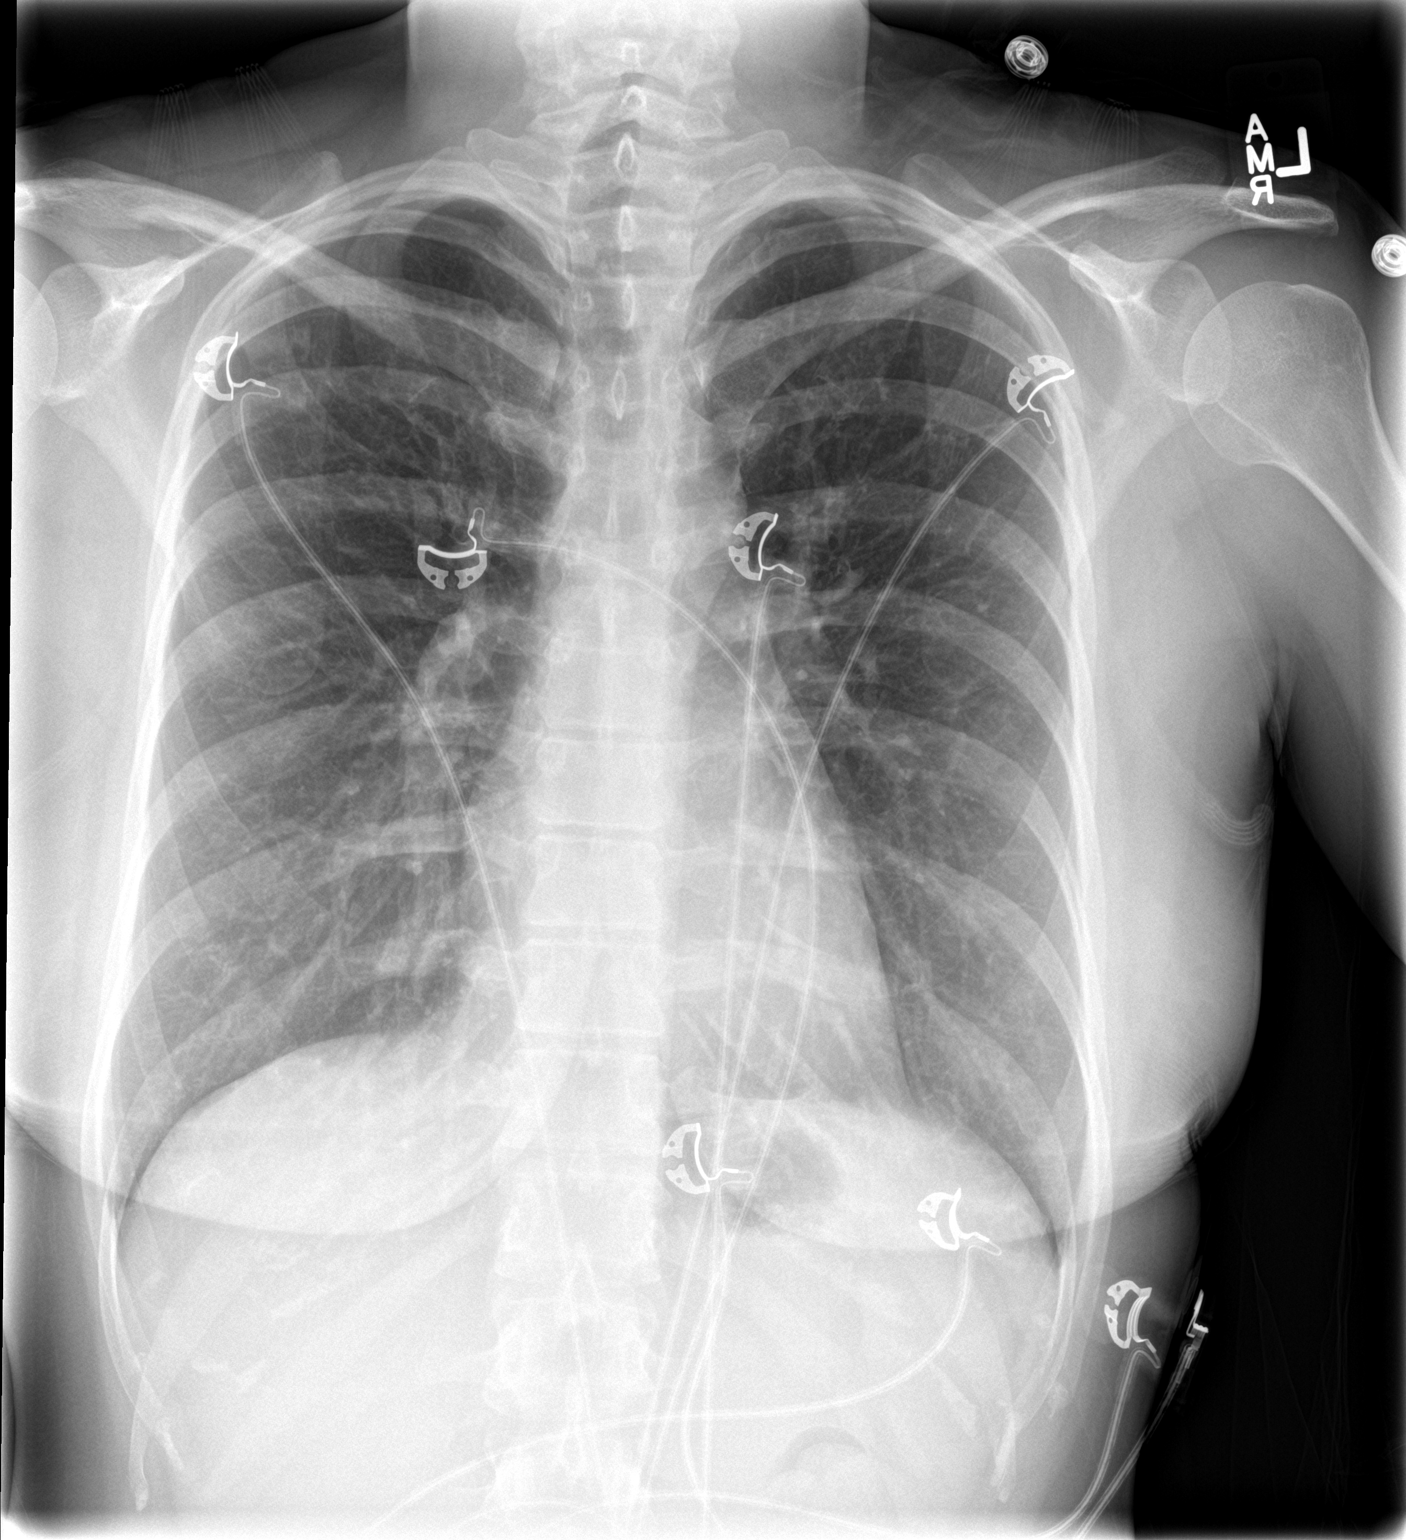

[chest lat]
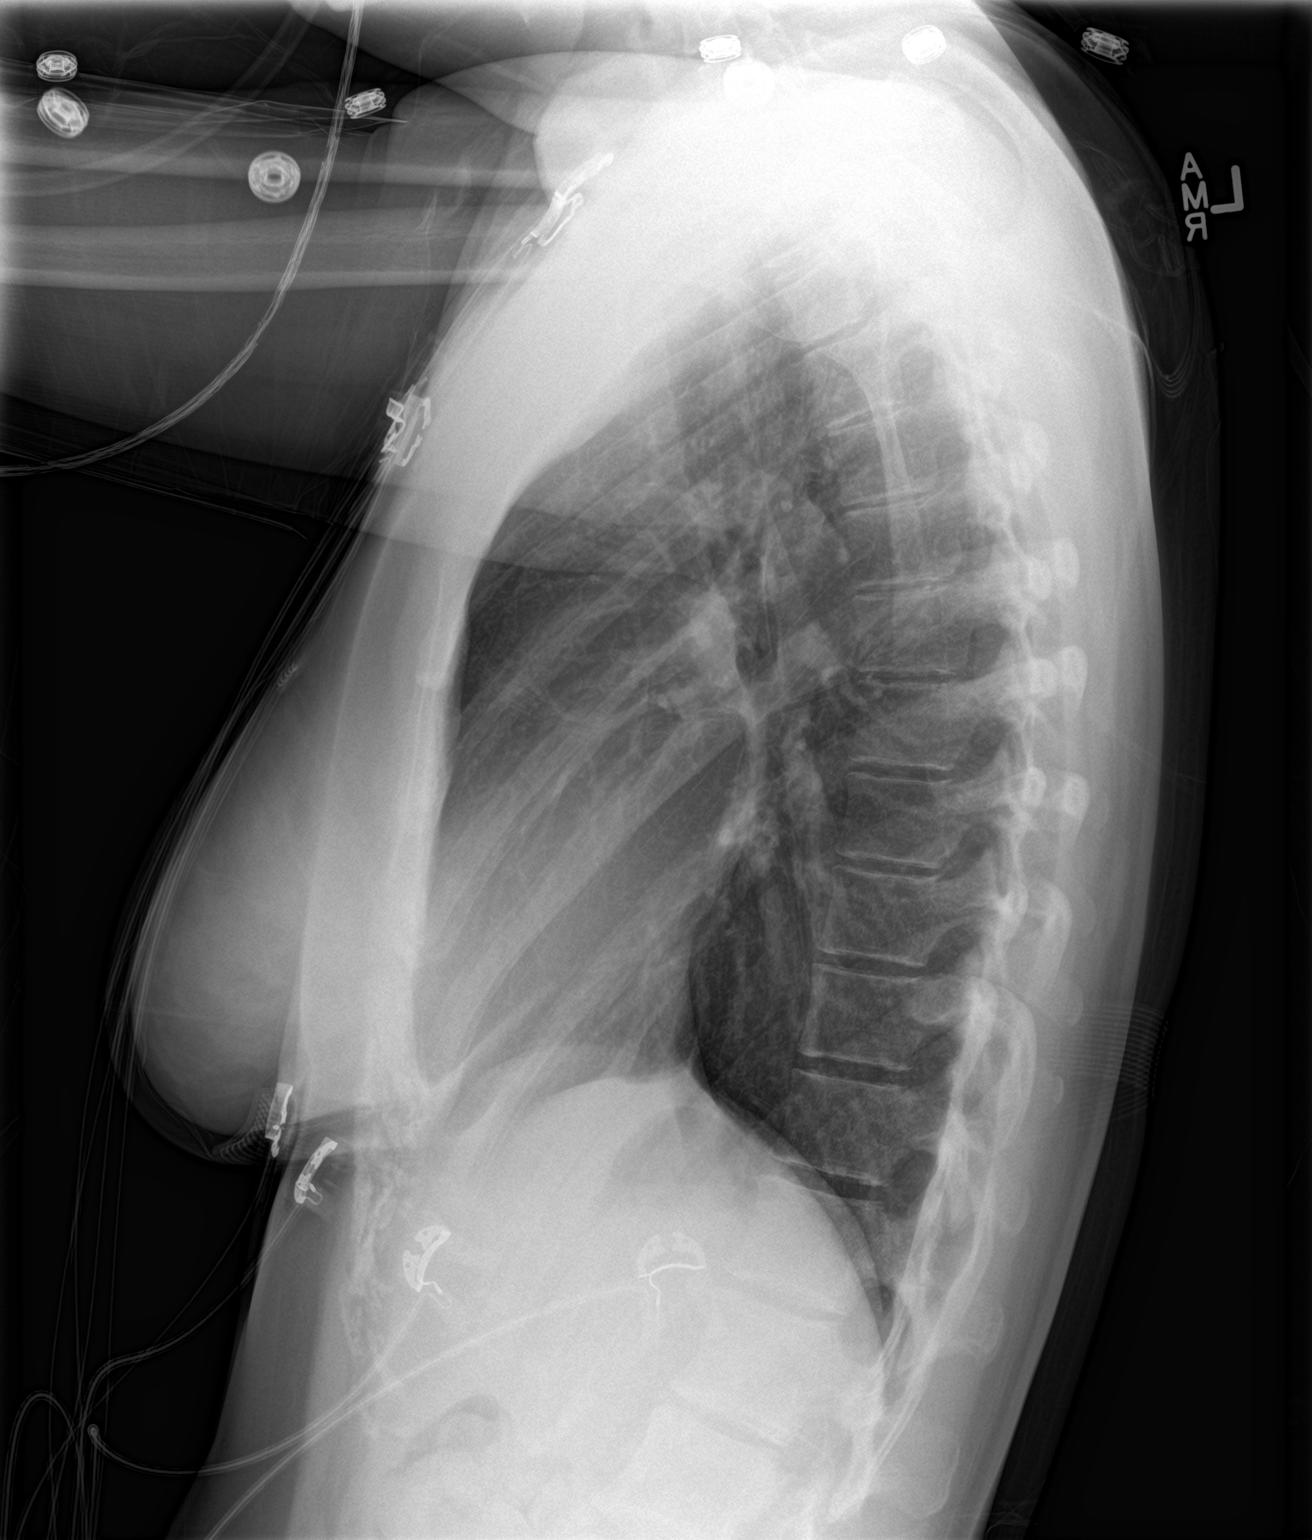

[2 of 2 positions shown; findings below may reference images not displayed]

FINDINGS: Cardiomediastinal silhouette is normal. Mediastinal contours appear
intact.

There is no evidence of focal airspace consolidation, pleural
effusion or pneumothorax.

Osseous structures are without acute abnormality. Soft tissues are
grossly normal.
IMPRESSION: No active cardiopulmonary disease.
